# Patient Record
Sex: Female | Born: 1985 | Race: Black or African American | Hispanic: No | Marital: Single | Smoking: Current every day smoker
Health system: Southern US, Community
[De-identification: ages and names within clinical notes are randomized; demographics above are authoritative.]

## PROBLEM LIST (undated history)

## (undated) DIAGNOSIS — N83299 Other ovarian cyst, unspecified side: Secondary | ICD-10-CM

## (undated) DIAGNOSIS — G43909 Migraine, unspecified, not intractable, without status migrainosus: Secondary | ICD-10-CM

## (undated) DIAGNOSIS — F419 Anxiety disorder, unspecified: Secondary | ICD-10-CM

## (undated) DIAGNOSIS — I10 Essential (primary) hypertension: Secondary | ICD-10-CM

---
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
## 2003-07-23 ENCOUNTER — Emergency Department (HOSPITAL_COMMUNITY): Admission: EM | Admit: 2003-07-23 | Discharge: 2003-07-23 | Payer: Self-pay | Admitting: Emergency Medicine

## 2003-12-03 ENCOUNTER — Inpatient Hospital Stay (HOSPITAL_COMMUNITY): Admission: AD | Admit: 2003-12-03 | Discharge: 2003-12-03 | Payer: Self-pay | Admitting: Family Medicine

## 2004-02-18 ENCOUNTER — Emergency Department (HOSPITAL_COMMUNITY): Admission: EM | Admit: 2004-02-18 | Discharge: 2004-02-19 | Payer: Self-pay | Admitting: Emergency Medicine

## 2004-04-27 ENCOUNTER — Inpatient Hospital Stay (HOSPITAL_COMMUNITY): Admission: AD | Admit: 2004-04-27 | Discharge: 2004-04-27 | Payer: Self-pay | Admitting: Family Medicine

## 2004-06-03 ENCOUNTER — Inpatient Hospital Stay (HOSPITAL_COMMUNITY): Admission: EM | Admit: 2004-06-03 | Discharge: 2004-06-06 | Payer: Self-pay | Admitting: Psychiatry

## 2004-06-03 ENCOUNTER — Ambulatory Visit: Payer: Self-pay | Admitting: Psychiatry

## 2004-08-12 ENCOUNTER — Inpatient Hospital Stay (HOSPITAL_COMMUNITY): Admission: AD | Admit: 2004-08-12 | Discharge: 2004-08-12 | Payer: Self-pay | Admitting: *Deleted

## 2004-09-21 ENCOUNTER — Inpatient Hospital Stay (HOSPITAL_COMMUNITY): Admission: AD | Admit: 2004-09-21 | Discharge: 2004-09-21 | Payer: Self-pay | Admitting: Obstetrics and Gynecology

## 2004-11-20 ENCOUNTER — Ambulatory Visit: Payer: Self-pay | Admitting: *Deleted

## 2004-11-20 ENCOUNTER — Inpatient Hospital Stay (HOSPITAL_COMMUNITY): Admission: AD | Admit: 2004-11-20 | Discharge: 2004-11-21 | Payer: Self-pay | Admitting: Obstetrics & Gynecology

## 2004-12-06 ENCOUNTER — Ambulatory Visit: Payer: Self-pay | Admitting: Obstetrics and Gynecology

## 2004-12-06 ENCOUNTER — Inpatient Hospital Stay (HOSPITAL_COMMUNITY): Admission: AD | Admit: 2004-12-06 | Discharge: 2004-12-06 | Payer: Self-pay | Admitting: Obstetrics & Gynecology

## 2004-12-09 ENCOUNTER — Ambulatory Visit: Payer: Self-pay | Admitting: *Deleted

## 2004-12-09 ENCOUNTER — Inpatient Hospital Stay (HOSPITAL_COMMUNITY): Admission: AD | Admit: 2004-12-09 | Discharge: 2004-12-12 | Payer: Self-pay | Admitting: *Deleted

## 2004-12-10 ENCOUNTER — Encounter (INDEPENDENT_AMBULATORY_CARE_PROVIDER_SITE_OTHER): Payer: Self-pay | Admitting: *Deleted

## 2006-02-23 ENCOUNTER — Emergency Department (HOSPITAL_COMMUNITY): Admission: EM | Admit: 2006-02-23 | Discharge: 2006-02-23 | Payer: Self-pay | Admitting: Family Medicine

## 2006-02-26 ENCOUNTER — Emergency Department (HOSPITAL_COMMUNITY): Admission: EM | Admit: 2006-02-26 | Discharge: 2006-02-26 | Payer: Self-pay | Admitting: Emergency Medicine

## 2006-03-13 ENCOUNTER — Emergency Department (HOSPITAL_COMMUNITY): Admission: EM | Admit: 2006-03-13 | Discharge: 2006-03-13 | Payer: Self-pay | Admitting: Emergency Medicine

## 2006-06-27 ENCOUNTER — Emergency Department (HOSPITAL_COMMUNITY): Admission: EM | Admit: 2006-06-27 | Discharge: 2006-06-27 | Payer: Self-pay | Admitting: Emergency Medicine

## 2006-11-24 ENCOUNTER — Emergency Department (HOSPITAL_COMMUNITY): Admission: EM | Admit: 2006-11-24 | Discharge: 2006-11-25 | Payer: Self-pay | Admitting: Emergency Medicine

## 2009-03-12 ENCOUNTER — Emergency Department (HOSPITAL_COMMUNITY): Admission: EM | Admit: 2009-03-12 | Discharge: 2009-03-12 | Payer: Self-pay | Admitting: Emergency Medicine

## 2009-06-14 ENCOUNTER — Emergency Department (HOSPITAL_COMMUNITY): Admission: EM | Admit: 2009-06-14 | Discharge: 2009-06-15 | Payer: Self-pay | Admitting: Emergency Medicine

## 2010-06-03 ENCOUNTER — Emergency Department (HOSPITAL_COMMUNITY)
Admission: EM | Admit: 2010-06-03 | Discharge: 2010-06-03 | Disposition: A | Discharge status: Home / Self Care | Payer: Self-pay | Attending: Emergency Medicine | Admitting: Emergency Medicine

## 2010-06-03 DIAGNOSIS — F1010 Alcohol abuse, uncomplicated: Secondary | ICD-10-CM | POA: Insufficient documentation

## 2010-06-03 DIAGNOSIS — F411 Generalized anxiety disorder: Secondary | ICD-10-CM | POA: Insufficient documentation

## 2010-06-03 LAB — RAPID URINE DRUG SCREEN, HOSP PERFORMED
Barbiturates: NOT DETECTED
Benzodiazepines: NOT DETECTED
Cocaine: NOT DETECTED
Tetrahydrocannabinol: POSITIVE — AB

## 2010-06-03 LAB — CBC
MCH: 32.3 pg (ref 26.0–34.0)
MCV: 95.2 fL (ref 78.0–100.0)
RBC: 4.33 MIL/uL (ref 3.87–5.11)

## 2010-06-03 LAB — COMPREHENSIVE METABOLIC PANEL
ALT: 10 U/L (ref 0–35)
Alkaline Phosphatase: 91 U/L (ref 39–117)
BUN: 6 mg/dL (ref 6–23)
Glucose, Bld: 90 mg/dL (ref 70–99)
Total Protein: 7.9 g/dL (ref 6.0–8.3)

## 2010-06-03 LAB — ETHANOL: Alcohol, Ethyl (B): <5 mg/dL (ref 0–10)

## 2010-06-04 LAB — POCT I-STAT, CHEM 8
Chloride: 107 mEq/L (ref 96–112)
Glucose, Bld: 93 mg/dL (ref 70–99)
HCT: 50 % — ABNORMAL HIGH (ref 36.0–46.0)
Hemoglobin: 17 g/dL — ABNORMAL HIGH (ref 12.0–15.0)

## 2010-06-04 LAB — ETHANOL: Alcohol, Ethyl (B): 205 mg/dL — ABNORMAL HIGH (ref 0–10)

## 2010-06-16 LAB — RAPID STREP SCREEN (MED CTR MEBANE ONLY): Streptococcus, Group A Screen (Direct): NEGATIVE
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
## 2010-12-26 LAB — URINALYSIS, ROUTINE W REFLEX MICROSCOPIC
Glucose, UA: NEGATIVE
Nitrite: NEGATIVE
Specific Gravity, Urine: 1.02

## 2010-12-26 LAB — GC/CHLAMYDIA PROBE AMP, GENITAL
Chlamydia, DNA Probe: POSITIVE — AB
GC Probe Amp, Genital: NEGATIVE

## 2010-12-26 LAB — URINE MICROSCOPIC-ADD ON

## 2010-12-26 LAB — URINE CULTURE

## 2010-12-26 LAB — POCT PREGNANCY, URINE: Operator id: 277751

## 2010-12-26 LAB — WET PREP, GENITAL: Clue Cells Wet Prep HPF POC: NONE SEEN

## 2010-12-26 LAB — CBC
HCT: 39.7
MCV: 93.2
Platelets: 214
WBC: 13.6 — ABNORMAL HIGH

## 2011-02-10 ENCOUNTER — Encounter: Payer: Self-pay | Admitting: Emergency Medicine

## 2011-02-10 ENCOUNTER — Emergency Department (HOSPITAL_COMMUNITY): Payer: Self-pay

## 2011-02-10 ENCOUNTER — Emergency Department (HOSPITAL_COMMUNITY)
Admission: EM | Admit: 2011-02-10 | Discharge: 2011-02-10 | Disposition: A | Discharge status: Home / Self Care | Payer: No Typology Code available for payment source | Attending: Emergency Medicine | Admitting: Emergency Medicine

## 2011-02-10 DIAGNOSIS — S99929A Unspecified injury of unspecified foot, initial encounter: Secondary | ICD-10-CM | POA: Insufficient documentation

## 2011-02-10 DIAGNOSIS — I10 Essential (primary) hypertension: Secondary | ICD-10-CM | POA: Insufficient documentation

## 2011-02-10 DIAGNOSIS — IMO0002 Reserved for concepts with insufficient information to code with codable children: Secondary | ICD-10-CM | POA: Insufficient documentation

## 2011-02-10 DIAGNOSIS — S99921A Unspecified injury of right foot, initial encounter: Secondary | ICD-10-CM

## 2011-02-10 DIAGNOSIS — M79609 Pain in unspecified limb: Secondary | ICD-10-CM | POA: Insufficient documentation

## 2011-02-10 DIAGNOSIS — S8990XA Unspecified injury of unspecified lower leg, initial encounter: Secondary | ICD-10-CM | POA: Insufficient documentation

## 2011-02-10 HISTORY — DX: Essential (primary) hypertension: I10

## 2011-02-10 MED ORDER — IBUPROFEN 600 MG PO TABS: 600.0000 mg | ORAL_TABLET | Freq: Four times a day (QID) | ORAL | Status: AC | PRN
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
## 2011-02-10 NOTE — ED Provider Notes (Signed)
Medical screening examination/treatment/procedure(s) were performed by non-physician practitioner and as supervising physician I was immediately available for consultation/collaboration.  Nicholes Stairs, MD 02/10/11 [phone]
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
## 2011-02-10 NOTE — Progress Notes (Signed)
Orthopedic Tech Progress Note Patient Details:  Denise Perry [birth year] [id number]  Other Ortho Devices Ortho Device Location: OP SHOE   [name] 02/10/2011, 6:29 AM                             OP SHOE

## 2011-09-16 ENCOUNTER — Encounter (HOSPITAL_COMMUNITY): Payer: Self-pay | Admitting: Emergency Medicine

## 2011-09-16 ENCOUNTER — Emergency Department (HOSPITAL_COMMUNITY)
Admission: EM | Admit: 2011-09-16 | Discharge: 2011-09-16 | Disposition: A | Discharge status: Home / Self Care | Payer: Self-pay | Attending: Emergency Medicine | Admitting: Emergency Medicine

## 2011-09-16 DIAGNOSIS — IMO0002 Reserved for concepts with insufficient information to code with codable children: Secondary | ICD-10-CM | POA: Insufficient documentation

## 2011-09-16 DIAGNOSIS — L237 Allergic contact dermatitis due to plants, except food: Secondary | ICD-10-CM

## 2011-09-16 DIAGNOSIS — L255 Unspecified contact dermatitis due to plants, except food: Secondary | ICD-10-CM | POA: Insufficient documentation

## 2011-09-16 DIAGNOSIS — I10 Essential (primary) hypertension: Secondary | ICD-10-CM | POA: Insufficient documentation

## 2011-09-16 MED ORDER — PREDNISONE 20 MG PO TABS: 40.0000 mg | ORAL_TABLET | Freq: Every day | ORAL | Status: DC

## 2011-09-16 MED ORDER — PREDNISONE 20 MG PO TABS
60.0000 mg | ORAL_TABLET | Freq: Once | ORAL | Status: AC
  Administered 2011-09-16: 60 mg via ORAL
  Filled 2011-09-16: qty 3

## 2011-09-16 NOTE — ED Notes (Signed)
Pt received 50mg  benadryl po PTA via EMS

## 2011-09-16 NOTE — ED Notes (Signed)
Pt arrives via EMS for itchy rash that began Friday.

## 2011-09-16 NOTE — ED Notes (Signed)
Socks given to pt and a blanket

## 2011-09-16 NOTE — ED Notes (Signed)
Pt denies any pain, pt's respirations are equal and non labored. 

## 2011-09-16 NOTE — ED Provider Notes (Signed)
History     CSN: [id number]  Arrival date & time 09/16/11  0725   First MD Initiated Contact with Patient 09/16/11 [phone]      Chief Complaint  Patient presents with  . Rash    (Consider location/radiation/quality/duration/timing/severity/associated sxs/prior treatment) Patient is a 26 y.o. female presenting with rash. The history is provided by the patient.  Rash   She had a generalized rash for the last 5 days. She has one son who is a similar rash and another son who does not. There is poison ivy around where they are staying and she is concerned it might be poison ivy. The rash has been spreading and is on all parts of her body now. It is very pruritic. She denies difficulty breathing or swallowing.  Past Medical History  Diagnosis Date  . Hypertension     History reviewed. No pertinent past surgical history.  History reviewed. No pertinent family history.  History  Substance Use Topics  . Smoking status: Never Smoker   . Smokeless tobacco: Not on file  . Alcohol Use: No    OB History    Grav Para Term Preterm Abortions TAB SAB Ect Mult Living                  Review of Systems  Skin: Positive for rash.  All other systems reviewed and are negative.    Allergies  Review of patient's allergies indicates no known allergies.  Home Medications   Current Outpatient Rx  Name Route Sig Dispense Refill  . PREDNISONE 20 MG PO TABS Oral Take 2 tablets (40 mg total) by mouth daily. 30 tablet 0    BP 126/79  Pulse 78  Temp 98.2 F (36.8 C) (Oral)  Resp 20  SpO2 100%  Physical Exam  Nursing note and vitals reviewed.  26 year old female who is resting comfortably and in no acute distress. Vital signs are normal. Oxygen saturation is 100% which is normal. Head is normocephalic and atraumatic. PERRLA, EOMI. Oropharynx is clear. Neck is nontender and supple without adenopathy or stridor. Back is nontender. Lungs are clear without rales, wheezes, or rhonchi. Heart has  regular rate and rhythm without murmur. Abdomen is soft, flat, nontender without masses or hepatosplenomegaly. Extremities have no cyanosis or edema, full range of motion is present. Skin is warm and dry. Multiple raised erythematous lesions with some areas of vesiculation are present. Lesions are grouped in linear clusters suggestive of contact dermatitis such as poison ivy. Neurologic: Mental status is normal, cranial nerves are intact, there are no motor or sensory deficits.  ED Course  Procedures (including critical care time)  Labs Reviewed - No data to display No results found.   1. Poison ivy       MDM  Rash most consistent with poison ivy dermatitis. She will be treated with oral steroids and oral loratadine        Dione Booze, MD 09/16/11 [phone]

## 2011-10-11 ENCOUNTER — Encounter (HOSPITAL_COMMUNITY): Payer: Self-pay | Admitting: Emergency Medicine

## 2011-10-11 ENCOUNTER — Emergency Department (HOSPITAL_COMMUNITY)
Admission: EM | Admit: 2011-10-11 | Discharge: 2011-10-11 | Disposition: A | Discharge status: Home / Self Care | Payer: Self-pay | Attending: Emergency Medicine | Admitting: Emergency Medicine

## 2011-10-11 DIAGNOSIS — F1010 Alcohol abuse, uncomplicated: Secondary | ICD-10-CM | POA: Insufficient documentation

## 2011-10-11 DIAGNOSIS — F10129 Alcohol abuse with intoxication, unspecified: Secondary | ICD-10-CM

## 2011-10-11 DIAGNOSIS — F10929 Alcohol use, unspecified with intoxication, unspecified: Secondary | ICD-10-CM

## 2011-10-11 DIAGNOSIS — I10 Essential (primary) hypertension: Secondary | ICD-10-CM | POA: Insufficient documentation

## 2011-10-11 MED ORDER — GI COCKTAIL ~~LOC~~
30.0000 mL | Freq: Once | ORAL | Status: AC
  Administered 2011-10-11: 30 mL via ORAL
  Filled 2011-10-11: qty 30

## 2011-10-11 MED ORDER — ONDANSETRON 8 MG PO TBDP: 8.0000 mg | ORAL_TABLET | Freq: Three times a day (TID) | ORAL | Status: AC | PRN

## 2011-10-11 MED ORDER — ONDANSETRON 4 MG PO TBDP
4.0000 mg | ORAL_TABLET | Freq: Once | ORAL | Status: AC
  Administered 2011-10-11: 4 mg via ORAL
  Filled 2011-10-11: qty 1

## 2011-10-11 NOTE — ED Notes (Signed)
Patient here with children.

## 2011-10-11 NOTE — ED Provider Notes (Signed)
Medical screening examination/treatment/procedure(s) were performed by non-physician practitioner and as supervising physician I was immediately available for consultation/collaboration.  Luv Mish M Christe Tellez, MD 10/11/11 0759 

## 2011-10-11 NOTE — ED Notes (Signed)
Pt comes to the ED c/o dizziness from drinking a cup of vodka.  Pt states she thinks she had too much to drink and vomited twice prior to arrival in the ED.
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
## 2011-10-11 NOTE — ED Provider Notes (Signed)
History     CSN: [id number]  Arrival date & time 10/11/11  9147   First MD Initiated Contact with Patient 10/11/11 0448     5:35 AM HPI Patient reports drinking a cup of vodka. Reports she came by EMS today due to feeling dehydrated for vomiting twice, and headache and dizziness. Patient reports she has not usually  Patient is a 26 y.o. female presenting with intoxication. The history is provided by the patient.  Alcohol Intoxication This is a new problem. The current episode started today. The problem has been unchanged. Associated symptoms include nausea and vomiting.    Past Medical History  Diagnosis Date  . Hypertension     History reviewed. No pertinent past surgical history.  No family history on file.  History  Substance Use Topics  . Smoking status: Never Smoker   . Smokeless tobacco: Not on file  . Alcohol Use: No    OB History    Grav Para Term Preterm Abortions TAB SAB Ect Mult Living                  Review of Systems  Gastrointestinal: Positive for nausea and vomiting.  All other systems reviewed and are negative.    Allergies  Review of patient's allergies indicates no known allergies.  Home Medications   Current Outpatient Rx  Name Route Sig Dispense Refill  . ACETAMINOPHEN 500 MG PO TABS Oral Take 500 mg by mouth every 6 (six) hours as needed. For pain      BP 111/72  Pulse 79  Temp 97.4 F (36.3 C) (Oral)  Resp 14  SpO2 99%  Physical Exam  Vitals reviewed. Constitutional: She is oriented to person, place, and time. Vital signs are normal. She appears well-developed and well-nourished.       Patient sleeping comfortably in room.  HENT:  Head: Normocephalic and atraumatic.  Eyes: Conjunctivae are normal. Pupils are equal, round, and reactive to light.  Neck: Normal range of motion. Neck supple.  Cardiovascular: Normal rate, regular rhythm and normal heart sounds.  Exam reveals no friction rub.   No murmur heard. Pulmonary/Chest:  Effort normal and breath sounds normal. She has no wheezes. She has no rhonchi. She has no rales. She exhibits no tenderness.  Musculoskeletal: Normal range of motion.  Neurological: She is alert and oriented to person, place, and time. Coordination normal.  Skin: Skin is warm and dry. No rash noted. No erythema. No pallor.    ED Course  Procedures  MDM  Will order Zofran and GI cocktail. Also order a by mouth trial. Discussed with patient he voices understanding. Advised patient structures warm water. Do not feel like lab work is necessary at this time. Patient does not appear to be in any acute distress in length he is having symptoms do to alcohol intoxication       Thomasene Lot, PA-C 10/11/11 [phone]

## 2011-11-21 ENCOUNTER — Encounter (HOSPITAL_COMMUNITY): Payer: Self-pay | Admitting: Emergency Medicine

## 2011-11-21 ENCOUNTER — Emergency Department (HOSPITAL_COMMUNITY): Payer: Self-pay

## 2011-11-21 ENCOUNTER — Emergency Department (HOSPITAL_COMMUNITY)
Admission: EM | Admit: 2011-11-21 | Discharge: 2011-11-21 | Disposition: A | Discharge status: Home / Self Care | Payer: Self-pay | Attending: Emergency Medicine | Admitting: Emergency Medicine

## 2011-11-21 DIAGNOSIS — F17200 Nicotine dependence, unspecified, uncomplicated: Secondary | ICD-10-CM | POA: Insufficient documentation

## 2011-11-21 DIAGNOSIS — I10 Essential (primary) hypertension: Secondary | ICD-10-CM | POA: Insufficient documentation

## 2011-11-21 DIAGNOSIS — F19988 Other psychoactive substance use, unspecified with other psychoactive substance-induced disorder: Secondary | ICD-10-CM | POA: Insufficient documentation

## 2011-11-21 DIAGNOSIS — F19929 Other psychoactive substance use, unspecified with intoxication, unspecified: Secondary | ICD-10-CM

## 2011-11-21 MED ORDER — LORAZEPAM 1 MG PO TABS
1.0000 mg | ORAL_TABLET | Freq: Once | ORAL | Status: AC
  Administered 2011-11-21: 1 mg via ORAL
  Filled 2011-11-21: qty 1

## 2011-11-21 NOTE — ED Provider Notes (Signed)
History     CSN: [id number]  Arrival date & time 11/21/11  1423   None     Chief Complaint  Patient presents with  . Anxiety    (Consider location/radiation/quality/duration/timing/severity/associated sxs/prior treatment) HPI Comments: Patient presents with chest pain and anxiety after taking an ecstasy pill about 7 hours ago. She reports taking the pill and smoking marijuana afterwards. Shortly after, she became anxious and upset. She reports chest pain, SOB, and feeling like her heart is racing. She describes the pain as burning and substernal without radiation. She denies any aggravating or alleviating factors. She reports associated dry mouth. She has never had this reaction to ectasy prior to this episode, according to the patient.   Patient is a 26 y.o. female presenting with anxiety.  Anxiety Associated symptoms include chest pain, chills, numbness and weakness.    Past Medical History  Diagnosis Date  . Hypertension     History reviewed. No pertinent past surgical history.  No family history on file.  History  Substance Use Topics  . Smoking status: Current Everyday Smoker -- 1.0 packs/day  . Smokeless tobacco: Not on file  . Alcohol Use: Yes     beer 18 oz    OB History    Grav Para Term Preterm Abortions TAB SAB Ect Mult Living                  Review of Systems  Constitutional: Positive for chills.  Respiratory: Positive for shortness of breath.   Cardiovascular: Positive for chest pain.  Neurological: Positive for weakness and numbness.  All other systems reviewed and are negative.    Allergies  Review of patient's allergies indicates no known allergies.  Home Medications  No current outpatient prescriptions on file.  BP 173/156  Pulse 95  Temp 98.2 F (36.8 C) (Oral)  Resp 24  SpO2 100%  Physical Exam  Nursing note and vitals reviewed. Constitutional: She is oriented to person, place, and time. She appears well-developed and  well-nourished. She appears distressed.  HENT:  Head: Normocephalic and atraumatic.  Nose: Nose normal.  Mouth/Throat: Oropharynx is clear and moist. No oropharyngeal exudate.  Eyes: Conjunctivae and EOM are normal. No scleral icterus.       Pupils dilated and not reactive to light.   Neck: Normal range of motion. Neck supple.  Cardiovascular: Normal rate, regular rhythm and intact distal pulses.  Exam reveals no gallop and no friction rub.   No murmur heard. Pulmonary/Chest: Effort normal and breath sounds normal. No respiratory distress. She has no wheezes. She has no rales. She exhibits no tenderness.  Abdominal: Soft. There is no tenderness.  Musculoskeletal: Normal range of motion.  Neurological: She is alert and oriented to person, place, and time. Coordination normal.  Skin: Skin is warm and dry. She is not diaphoretic.  Psychiatric:       Patient is anxious and crying.     ED Course  Procedures (including critical care time)  Labs Reviewed - No data to display No results found.   No diagnosis found.    MDM  3:37 PM Patient is anxious and upset, complaining of chest pain and SOB. I will order an EKG, chest xray, and ativan. I will reassess when her tests have resulted.   5:58 PM Patient's EKG is unremarkable and she is feeling better and would like to go home. No further evaluation needed at this time.      Emilia Beck, PA-C 11/21/11 1758

## 2011-11-21 NOTE — ED Notes (Addendum)
Pt asking for water while attempting to obtain VS. Explained policy to pt that she may not have anything to eat or drink until EDP evaluates her. Pt states "my mouth is dry, this IV isn't making my mouth dry, I have cotton mouth!" Pt then proceeded to get up and drink water from sink. Nurse notified.

## 2011-11-21 NOTE — ED Notes (Signed)
Pt states she took an ectasy pill early this morning around 0900. Is crying, upset agitated.

## 2011-11-21 NOTE — ED Notes (Signed)
Pt alert and oriented x4. Respirations even and unlabored, bilateral symmetrical rise and fall of chest. Skin warm and dry. In no acute distress. Denies needs.   

## 2011-11-22 NOTE — ED Provider Notes (Signed)
Medical screening examination/treatment/procedure(s) were performed by non-physician practitioner and as supervising physician I was immediately available for consultation/collaboration.   Richardean Canal, MD 11/22/11 [phone]

## 2012-05-27 ENCOUNTER — Emergency Department (HOSPITAL_COMMUNITY)
Admission: EM | Admit: 2012-05-27 | Discharge: 2012-05-27 | Disposition: A | Discharge status: Home / Self Care | Payer: Self-pay | Attending: Emergency Medicine | Admitting: Emergency Medicine

## 2012-05-27 ENCOUNTER — Other Ambulatory Visit: Payer: Self-pay

## 2012-05-27 DIAGNOSIS — R002 Palpitations: Secondary | ICD-10-CM | POA: Insufficient documentation

## 2012-05-27 DIAGNOSIS — I10 Essential (primary) hypertension: Secondary | ICD-10-CM | POA: Insufficient documentation

## 2012-05-27 DIAGNOSIS — I517 Cardiomegaly: Secondary | ICD-10-CM | POA: Insufficient documentation

## 2012-05-27 DIAGNOSIS — I4510 Unspecified right bundle-branch block: Secondary | ICD-10-CM | POA: Insufficient documentation

## 2012-05-27 DIAGNOSIS — I440 Atrioventricular block, first degree: Secondary | ICD-10-CM | POA: Insufficient documentation

## 2012-05-27 DIAGNOSIS — F17200 Nicotine dependence, unspecified, uncomplicated: Secondary | ICD-10-CM | POA: Insufficient documentation

## 2012-05-27 NOTE — ED Provider Notes (Signed)
History     CSN: [id number]  Arrival date & time 05/27/12  4098   First MD Initiated Contact with Patient 05/27/12 0133      Chief Complaint  Patient presents with  . Chest Pain  . Panic Attack    (Consider location/radiation/quality/duration/timing/severity/associated sxs/prior treatment) HPI 27 yo woman with history of HTN who presents via EMS from home with two young children. Says she awoke from sleep with tightness in her chest. Long history of same since "I was a kid". Seems to occur randomly. Patient says she has associated tight feeling with breathing. No true SOB. Notes associated feeling of rapid heart beat. Entire episode lasted 2 to 3 minutes and resolved without intervention.   Sx seem to intensify with she gets worried about the cause of her chest tightness. She is currently symptom free at present.   Denies street drug use. Smokes 1ppd.     Past Medical History  Diagnosis Date  . Hypertension     No past surgical history on file.  No family history on file.  History  Substance Use Topics  . Smoking status: Current Every Day Smoker -- 1.00 packs/day  . Smokeless tobacco: Not on file  . Alcohol Use: Yes     Comment: beer 18 oz    OB History   Grav Para Term Preterm Abortions TAB SAB Ect Mult Living                  Review of Systems Gen: no weight loss, fevers, chills, night sweats Eyes: no discharge or drainage, no occular pain or visual changes Nose: no epistaxis or rhinorrhea Mouth: no dental pain, no sore throat Neck: no neck pain Lungs: no SOB, cough, wheezing CV: As per history of present illness, otherwise negative Abd: no abdominal pain, nausea, vomiting GU: no dysuria or gross hematuria MSK: no myalgias or arthralgias Neuro: no headache, no focal neurologic deficits Skin: no rash Psyche: Patient has anxiety associated with the events described above. However, otherwise negative   Allergies  Review of patient's allergies indicates  no known allergies.  Home Medications  No current outpatient prescriptions on file.  BP 110/60  Pulse 80  Temp(Src) 97.1 F (36.2 C) (Oral)  Resp 19  SpO2 98%  LMP 05/06/2012  Physical Exam Gen: well developed and well nourished appearing Head: NCAT Eyes: PERL, EOMI Nose: no epistaixis or rhinorrhea Mouth/throat: mucosa is moist and pink Neck: supple, no stridor Lungs: CTA B, no wheezing, rhonchi or rales Cardiovascular: Regular rate and rhythm without murmur, strong and equal peripheral pulses. Abd: soft, notender, nondistended Back: no ttp, no cva ttp Skin: no rashese, wnl Neuro: CN ii-xii grossly intact, no focal deficits Psyche; normal affect,  calm and cooperative.   ED Course  Procedures (including critical care time)  EKG shows a normal sinus rhythm with a rate of about 90 there is first degree AV block noticed, there is incomplete right bundle-branch block noted, left ventricular hypertrophy is noted, intervals are normal.   MDM  Patient describes symptoms that occur almost every day and have been ongoing for at least 10 years. She says her symptoms were in no way different tonight than they have been before. She denies chest pain and SOB. Her EKG is reassuring. I wonder if she is experiencing episodes of SVT or, perhaps, ectopic beats. The patient is stable for discharge. She has Medicaid pending and will follow up with her assigned PCP to discuss outpatient cardiac monitoring, ie. Holter monitoring.  I have counseled the patient to return to the ED for symptoms that last longer than 30 minutes or if she has any urgent health concerns. She says she is comfortable with this plan.         Brandt Loosen, MD 05/27/12 [phone]

## 2012-05-27 NOTE — ED Notes (Signed)
Pt was awakened from sleep with pain in the center of her chest, SOB, and dry mouth.  This was accompanied by anxiety.  Pt states that she has had this happen several times in the past and it was concluded that it was anxiety.  2 children at bedside.  EMS 126/74, 92, 20, 100%RA.

## 2012-10-27 ENCOUNTER — Encounter (HOSPITAL_COMMUNITY): Payer: Self-pay | Admitting: *Deleted

## 2012-10-27 DIAGNOSIS — N390 Urinary tract infection, site not specified: Secondary | ICD-10-CM | POA: Insufficient documentation

## 2012-10-27 DIAGNOSIS — I10 Essential (primary) hypertension: Secondary | ICD-10-CM | POA: Insufficient documentation

## 2012-10-27 DIAGNOSIS — Z3202 Encounter for pregnancy test, result negative: Secondary | ICD-10-CM | POA: Insufficient documentation

## 2012-10-27 DIAGNOSIS — F17200 Nicotine dependence, unspecified, uncomplicated: Secondary | ICD-10-CM | POA: Insufficient documentation

## 2012-10-27 LAB — URINALYSIS, ROUTINE W REFLEX MICROSCOPIC
Bilirubin Urine: NEGATIVE
Glucose, UA: NEGATIVE mg/dL
Specific Gravity, Urine: 1.016 (ref 1.005–1.030)
Urobilinogen, UA: 1 mg/dL (ref 0.0–1.0)

## 2012-10-27 LAB — CBC WITH DIFFERENTIAL/PLATELET
Lymphocytes Relative: 17 % (ref 12–46)
Lymphs Abs: 2.6 10*3/uL (ref 0.7–4.0)
Neutro Abs: 11.5 10*3/uL — ABNORMAL HIGH (ref 1.7–7.7)
Neutrophils Relative %: 76 % (ref 43–77)
Platelets: 216 10*3/uL (ref 150–400)
RBC: 4.48 MIL/uL (ref 3.87–5.11)
WBC: 15.1 10*3/uL — ABNORMAL HIGH (ref 4.0–10.5)

## 2012-10-27 LAB — COMPREHENSIVE METABOLIC PANEL
ALT: 9 U/L (ref 0–35)
Alkaline Phosphatase: 79 U/L (ref 39–117)
CO2: 22 mEq/L (ref 19–32)
GFR calc Af Amer: >90 mL/min (ref >90)
GFR calc non Af Amer: >90 mL/min (ref >90)
Glucose, Bld: 85 mg/dL (ref 70–99)
Potassium: 3.9 mEq/L (ref 3.5–5.1)
Sodium: 136 mEq/L (ref 135–145)

## 2012-10-27 LAB — URINE MICROSCOPIC-ADD ON

## 2012-10-27 NOTE — ED Notes (Signed)
The pt arrived by ems abd pain for 3 days.  Sl diarrhea and pressure sensation.  lmp aug 2

## 2012-10-27 NOTE — ED Notes (Signed)
Pt with generalized lower abdominal pain for the last 3 days. Arrives via EMS. Denies nausea/vomiting/diarrhea/fever. Pt alert, oriented x4, NAD. Unknown LMP.

## 2012-10-27 NOTE — ED Notes (Signed)
Pt refused repeat vital signs in waiting room.

## 2012-10-28 ENCOUNTER — Emergency Department (HOSPITAL_COMMUNITY)
Admission: EM | Admit: 2012-10-28 | Discharge: 2012-10-28 | Disposition: A | Discharge status: Home / Self Care | Payer: Self-pay | Attending: Emergency Medicine | Admitting: Emergency Medicine

## 2012-10-28 ENCOUNTER — Emergency Department (HOSPITAL_COMMUNITY): Payer: Self-pay

## 2012-10-28 DIAGNOSIS — N390 Urinary tract infection, site not specified: Secondary | ICD-10-CM

## 2012-10-28 MED ORDER — DEXTROSE 5 % IV SOLN
1.0000 g | Freq: Once | INTRAVENOUS | Status: AC
  Administered 2012-10-28: 1 g via INTRAVENOUS
  Filled 2012-10-28: qty 10

## 2012-10-28 MED ORDER — CEPHALEXIN 500 MG PO CAPS: 500.0000 mg | ORAL_CAPSULE | Freq: Four times a day (QID) | ORAL | Status: DC

## 2012-10-28 MED ORDER — SODIUM CHLORIDE 0.9 % IV BOLUS (SEPSIS)
1000.0000 mL | Freq: Once | INTRAVENOUS | Status: AC
  Administered 2012-10-28: 1000 mL via INTRAVENOUS

## 2012-10-28 MED ORDER — ONDANSETRON HCL 4 MG PO TABS: 4.0000 mg | ORAL_TABLET | Freq: Four times a day (QID) | ORAL | Status: DC

## 2012-10-28 MED ORDER — ONDANSETRON HCL 4 MG/2ML IJ SOLN
4.0000 mg | Freq: Once | INTRAMUSCULAR | Status: AC
  Administered 2012-10-28: 4 mg via INTRAVENOUS
  Filled 2012-10-28: qty 2

## 2012-10-28 NOTE — ED Notes (Signed)
Pt transported to [facility]

## 2012-10-28 NOTE — ED Provider Notes (Signed)
CSN: [id number]     Arrival date & time 10/27/12  1922 History     First MD Initiated Contact with Patient 10/28/12 0100     Chief Complaint  Patient presents with  . Abdominal Pain   (Consider location/radiation/quality/duration/timing/severity/associated sxs/prior Treatment) HPI Comments: Patient presents with 3 days of constant upper abdominal pain contrary to triage note it is not lower pain. Pain is right upper quadrant and epigastric. Nothing makes it better or worse. No nausea, vomiting, diarrhea, fever. No dysuria or hematuria. No vaginal bleeding or discharge. No change in bowel or bladder habits. No chest pain or shortness of breath. Has never had this pain in the past. Good by mouth intake and urine output.  The history is provided by the patient.    Past Medical History  Diagnosis Date  . Hypertension    History reviewed. No pertinent past surgical history. No family history on file. History  Substance Use Topics  . Smoking status: Current Every Day Smoker -- 1.00 packs/day  . Smokeless tobacco: Not on file  . Alcohol Use: Yes     Comment: beer 18 oz   OB History   Grav Para Term Preterm Abortions TAB SAB Ect Mult Living                 Review of Systems  Constitutional: Negative for fever, activity change and appetite change.  HENT: Negative for congestion and rhinorrhea.   Respiratory: Negative for cough, chest tightness and shortness of breath.   Cardiovascular: Negative for chest pain.  Gastrointestinal: Positive for abdominal pain. Negative for nausea, vomiting, diarrhea and constipation.  Genitourinary: Negative for dysuria, hematuria, vaginal bleeding and vaginal discharge.  Musculoskeletal: Negative for back pain.  Skin: Negative for rash.  Neurological: Negative for dizziness, weakness and headaches.  A complete 10 system review of systems was obtained and all systems are negative except as noted in the HPI and PMH.    Allergies  Review of  patient's allergies indicates no known allergies.  Home Medications   Current Outpatient Rx  Name  Route  Sig  Dispense  Refill  . ibuprofen (ADVIL,MOTRIN) 200 MG tablet   Oral   Take 600 mg by mouth every 6 (six) hours as needed for pain.         . cephALEXin (KEFLEX) 500 MG capsule   Oral   Take 1 capsule (500 mg total) by mouth 4 (four) times daily.   40 capsule   0   . ondansetron (ZOFRAN) 4 MG tablet   Oral   Take 1 tablet (4 mg total) by mouth every 6 (six) hours.   12 tablet   0    BP 111/73  Pulse 68  Temp(Src) 99.1 F (37.3 C) (Oral)  Resp 16  SpO2 100%  LMP 10/17/2012 Physical Exam  Constitutional: She is oriented to person, place, and time. She appears well-developed and well-nourished. No distress.  HENT:  Head: Normocephalic and atraumatic.  Mouth/Throat: Oropharynx is clear and moist. No oropharyngeal exudate.  Eyes: Conjunctivae and EOM are normal. Pupils are equal, round, and reactive to light.  Neck: Normal range of motion. Neck supple.  Cardiovascular: Normal rate, regular rhythm and normal heart sounds.   No murmur heard. Pulmonary/Chest: Effort normal and breath sounds normal.  Abdominal: Soft. There is tenderness. There is no rebound and no guarding.  Right upper quadrant epigastric tenderness without guarding or rebound. No lower abdominal tenderness. No pain at McBurney's point.  Musculoskeletal: Normal range of  motion. She exhibits no edema and no tenderness.  No CVA tenderness  Neurological: She is alert and oriented to person, place, and time. No cranial nerve deficit. She exhibits normal muscle tone. Coordination normal.  Skin: Skin is warm.    ED Course   Procedures (including critical care time)  Labs Reviewed  CBC WITH DIFFERENTIAL - Abnormal; Notable for the following:    WBC 15.1 (*)    Neutro Abs 11.5 (*)    All other components within normal limits  URINALYSIS, ROUTINE W REFLEX MICROSCOPIC - Abnormal; Notable for the  following:    APPearance CLOUDY (*)    Hgb urine dipstick LARGE (*)    Protein, ur 100 (*)    Nitrite POSITIVE (*)    Leukocytes, UA LARGE (*)    All other components within normal limits  URINE MICROSCOPIC-ADD ON - Abnormal; Notable for the following:    Squamous Epithelial / LPF FEW (*)    Bacteria, UA MANY (*)    All other components within normal limits  URINE CULTURE  COMPREHENSIVE METABOLIC PANEL  LIPASE, BLOOD  POCT PREGNANCY, URINE   US Abdomen Complete  10/28/2012   *RADIOLOGY REPORT*  Clinical Data:  Right upper quadrant abdominal pain.  COMPLETE ABDOMINAL ULTRASOUND  Comparison:  No priors.  Findings:  Gallbladder:  No shadowing gallstones or echogenic sludge.  No gallbladder wall thickening or pericholecystic fluid.  Negative sonographic Murphy's sign according to the ultrasound technologist.  Common bile duct:  Normal caliber measuring 3.3 mm in the porta hepatis.  Liver:  No focal mass lesion seen.  Within normal limits in parenchymal echogenicity.  IVC:  Patent throughout its visualized course in the abdomen.  Pancreas:  Although the pancreas is difficult to visualize in its entirety, no focal pancreatic abnormality is identified.  Spleen:  Normal size and echotexture without focal parenchymal abnormality.  9.5 cm in length.  Right Kidney:  No hydronephrosis.  Well-preserved cortex.  Normal size and parenchymal echotexture without focal abnormalities.  11.5 cm in length.  Left Kidney:  No hydronephrosis.  Well-preserved cortex.  Normal size and parenchymal echotexture without focal abnormalities.  11.7 cm in length.  Abdominal aorta:  Normal caliber measuring 1.8 cm in diameter proximally, and tapering appropriately distally.  IMPRESSION: 1.  No acute findings in the abdomen to account for the patient's symptoms.  Specifically, no evidence of gallstones or findings to suggest acute cholecystitis at this time.   Original Report Authenticated By: Trudie Reed, M.D.   1. Urinary  tract infection     MDM  3 day history of constant upper abdominal pain not associated symptoms. Abdomen is soft without peritoneal signs there is no lower abdominal tenderness. Denies vaginal bleeding or discharge.  Patient's urinalysis is grossly infected. White count is 15. HCG negative. Rocephin given for UTI.  [place] negative for any gallbladder or other pathology. Tolerating PO.  Keflex for UTI. Return with worsening pain, fever, vomiting.  Glynn Octave, MD 10/28/12 [phone]

## 2012-10-29 LAB — URINE CULTURE

## 2012-10-30 ENCOUNTER — Telehealth (HOSPITAL_COMMUNITY): Payer: Self-pay | Admitting: Emergency Medicine

## 2012-10-30 NOTE — ED Notes (Signed)
Post ED Visit - Positive Culture Follow-up  Culture report reviewed by antimicrobial stewardship pharmacist: []  [name], Pharm.D., BCPS []  [name], Pharm.D., BCPS []  [name], [address].D., BCPS []  [name], [address].D., BCPS, AAHIVP []  [name], Pharm.D., BCPS, AAHIVP [x]  [name], [address].D.  Positive urine culture Treated with Keflex, organism sensitive to the same and no further patient follow-up is required at this time.  Denise Perry 10/30/2012, 1:29 PM

## 2013-03-11 ENCOUNTER — Emergency Department (HOSPITAL_COMMUNITY)
Admission: EM | Admit: 2013-03-11 | Discharge: 2013-03-11 | Discharge status: Against Medical Advice | Payer: Self-pay | Attending: Emergency Medicine | Admitting: Emergency Medicine

## 2013-03-11 ENCOUNTER — Encounter (HOSPITAL_COMMUNITY): Payer: Self-pay | Admitting: Emergency Medicine

## 2013-03-11 DIAGNOSIS — F1010 Alcohol abuse, uncomplicated: Secondary | ICD-10-CM | POA: Insufficient documentation

## 2013-03-11 DIAGNOSIS — Z3202 Encounter for pregnancy test, result negative: Secondary | ICD-10-CM | POA: Insufficient documentation

## 2013-03-11 DIAGNOSIS — I10 Essential (primary) hypertension: Secondary | ICD-10-CM | POA: Insufficient documentation

## 2013-03-11 DIAGNOSIS — F17200 Nicotine dependence, unspecified, uncomplicated: Secondary | ICD-10-CM | POA: Insufficient documentation

## 2013-03-11 LAB — RAPID URINE DRUG SCREEN, HOSP PERFORMED: Barbiturates: NOT DETECTED

## 2013-03-11 LAB — URINALYSIS, ROUTINE W REFLEX MICROSCOPIC
Bilirubin Urine: NEGATIVE
Nitrite: NEGATIVE
Specific Gravity, Urine: 1.007 (ref 1.005–1.030)
Urobilinogen, UA: 0.2 mg/dL (ref 0.0–1.0)
pH: 6 (ref 5.0–8.0)

## 2013-03-11 LAB — URINE MICROSCOPIC-ADD ON

## 2013-03-11 LAB — POCT PREGNANCY, URINE: Preg Test, Ur: NEGATIVE

## 2013-03-11 NOTE — ED Notes (Signed)
Patient attempted to leave, encourage patient to stay, patient states "i'm leaving" and was observed walking out.

## 2013-03-11 NOTE — ED Notes (Signed)
Patient presents today via EMS reporting not feeling well after taking 7 shots of white liquor within 2 hours, patient reports nausea, poor historian.

## 2013-03-11 NOTE — ED Notes (Signed)
Pt states that she had three shots of alcohol today. Pt states that she didn't feel right and called the ambulance. Pt states that she is currently feeling fine. Pt denies SI or HI. Pt cooperative with vitals answer questions but not wanting to stay for too much longer.

## 2013-03-13 LAB — URINE CULTURE

## 2013-06-03 ENCOUNTER — Emergency Department (HOSPITAL_COMMUNITY)
Admission: EM | Admit: 2013-06-03 | Discharge: 2013-06-03 | Disposition: A | Discharge status: Home / Self Care | Payer: Self-pay | Attending: Emergency Medicine | Admitting: Emergency Medicine

## 2013-06-03 ENCOUNTER — Encounter (HOSPITAL_COMMUNITY): Payer: Self-pay | Admitting: Emergency Medicine

## 2013-06-03 DIAGNOSIS — R112 Nausea with vomiting, unspecified: Secondary | ICD-10-CM | POA: Insufficient documentation

## 2013-06-03 DIAGNOSIS — F17200 Nicotine dependence, unspecified, uncomplicated: Secondary | ICD-10-CM | POA: Insufficient documentation

## 2013-06-03 DIAGNOSIS — I10 Essential (primary) hypertension: Secondary | ICD-10-CM | POA: Insufficient documentation

## 2013-06-03 DIAGNOSIS — Z792 Long term (current) use of antibiotics: Secondary | ICD-10-CM | POA: Insufficient documentation

## 2013-06-03 DIAGNOSIS — Z3202 Encounter for pregnancy test, result negative: Secondary | ICD-10-CM | POA: Insufficient documentation

## 2013-06-03 DIAGNOSIS — R1013 Epigastric pain: Secondary | ICD-10-CM | POA: Insufficient documentation

## 2013-06-03 LAB — CBC WITH DIFFERENTIAL/PLATELET
BASOS PCT: 0 % (ref 0–1)
Basophils Absolute: 0 10*3/uL (ref 0.0–0.1)
EOS ABS: 0 10*3/uL (ref 0.0–0.7)
EOS PCT: 0 % (ref 0–5)
HCT: 41.6 % (ref 36.0–46.0)
HEMOGLOBIN: 14.7 g/dL (ref 12.0–15.0)
LYMPHS ABS: 2.2 10*3/uL (ref 0.7–4.0)
Lymphocytes Relative: 14 % (ref 12–46)
MCH: 33.6 pg (ref 26.0–34.0)
MCHC: 35.3 g/dL (ref 30.0–36.0)
MCV: 95 fL (ref 78.0–100.0)
MONOS PCT: 5 % (ref 3–12)
Monocytes Absolute: 0.8 10*3/uL (ref 0.1–1.0)
NEUTROS PCT: 80 % — AB (ref 43–77)
Neutro Abs: 12 10*3/uL — ABNORMAL HIGH (ref 1.7–7.7)
PLATELETS: 210 10*3/uL (ref 150–400)
RBC: 4.38 MIL/uL (ref 3.87–5.11)
RDW: 13.1 % (ref 11.5–15.5)
WBC: 14.9 10*3/uL — ABNORMAL HIGH (ref 4.0–10.5)

## 2013-06-03 LAB — COMPREHENSIVE METABOLIC PANEL
ALBUMIN: 4.4 g/dL (ref 3.5–5.2)
ALT: 9 U/L (ref 0–35)
AST: 13 U/L (ref 0–37)
Alkaline Phosphatase: 96 U/L (ref 39–117)
BUN: 8 mg/dL (ref 6–23)
CALCIUM: 9.7 mg/dL (ref 8.4–10.5)
CO2: 24 mEq/L (ref 19–32)
Chloride: 103 mEq/L (ref 96–112)
Creatinine, Ser: 0.74 mg/dL (ref 0.50–1.10)
GFR calc non Af Amer: >90 mL/min (ref >90)
GLUCOSE: 100 mg/dL — AB (ref 70–99)
POTASSIUM: 3.9 meq/L (ref 3.7–5.3)
Sodium: 141 mEq/L (ref 137–147)
Total Bilirubin: 0.5 mg/dL (ref 0.3–1.2)
Total Protein: 7.8 g/dL (ref 6.0–8.3)

## 2013-06-03 LAB — ETHANOL

## 2013-06-03 LAB — LIPASE, BLOOD: LIPASE: 16 U/L (ref 11–59)

## 2013-06-03 LAB — POC URINE PREG, ED: PREG TEST UR: NEGATIVE

## 2013-06-03 MED ORDER — ONDANSETRON HCL 4 MG/2ML IJ SOLN
4.0000 mg | Freq: Once | INTRAMUSCULAR | Status: AC
  Administered 2013-06-03: 4 mg via INTRAVENOUS
  Filled 2013-06-03: qty 2

## 2013-06-03 MED ORDER — SODIUM CHLORIDE 0.9 % IV BOLUS (SEPSIS)
1000.0000 mL | Freq: Once | INTRAVENOUS | Status: AC
  Administered 2013-06-03: 1000 mL via INTRAVENOUS

## 2013-06-03 MED ORDER — ONDANSETRON 4 MG PO TBDP: 4.0000 mg | ORAL_TABLET | Freq: Three times a day (TID) | ORAL | Status: DC | PRN

## 2013-06-03 NOTE — ED Notes (Signed)
Per [facility] pt came from home with nausea and vomiting that started today with greater than 10 episodes of vomiting- clear liquid most recently. Pt denies diarrhea. Denies urinary or vaginal symptoms. VSS en route.

## 2013-06-03 NOTE — ED Provider Notes (Signed)
CSN: [id number]     Arrival date & time 06/03/13  8295 History   First MD Initiated Contact with Patient 06/03/13 1842     Chief Complaint  Patient presents with  . Nausea  . Emesis     (Consider location/radiation/quality/duration/timing/severity/associated sxs/prior Treatment) HPI Comments: Patient presents with nausea and vomiting. She states it started about 9:00 this morning has been ongoing. She denies any bloody emesis. She denies any diarrhea. She has some pain in her epigastrium. She does have a history of alcohol use. She states that she drinks several beers last night. She denies that she drinks daily she has been here to the ED before for alcohol intoxication. She states the emesis has been ongoing through the day and has a malar keep anything down.  Patient is a 28 y.o. female presenting with vomiting.  Emesis Associated symptoms: abdominal pain   Associated symptoms: no arthralgias, no chills, no diarrhea and no headaches     Past Medical History  Diagnosis Date  . Hypertension    History reviewed. No pertinent past surgical history. No family history on file. History  Substance Use Topics  . Smoking status: Current Every Day Smoker -- 1.00 packs/day  . Smokeless tobacco: Not on file  . Alcohol Use: Yes     Comment: beer 18 oz occational   OB History   Grav Para Term Preterm Abortions TAB SAB Ect Mult Living                 Review of Systems  Constitutional: Negative for fever, chills, diaphoresis and fatigue.  HENT: Negative for congestion, rhinorrhea and sneezing.   Eyes: Negative.   Respiratory: Negative for cough, chest tightness and shortness of breath.   Cardiovascular: Negative for chest pain and leg swelling.  Gastrointestinal: Positive for nausea, vomiting and abdominal pain. Negative for diarrhea and blood in stool.  Genitourinary: Negative for frequency, hematuria, flank pain and difficulty urinating.  Musculoskeletal: Negative for arthralgias and  back pain.  Skin: Negative for rash.  Neurological: Negative for dizziness, speech difficulty, weakness, numbness and headaches.      Allergies  Review of patient's allergies indicates no known allergies.  Home Medications   Current Outpatient Rx  Name  Route  Sig  Dispense  Refill  . cephALEXin (KEFLEX) 500 MG capsule   Oral   Take 1 capsule (500 mg total) by mouth 4 (four) times daily.   40 capsule   0   . ibuprofen (ADVIL,MOTRIN) 200 MG tablet   Oral   Take 600 mg by mouth every 6 (six) hours as needed for pain.         Marland Kitchen ondansetron (ZOFRAN) 4 MG tablet   Oral   Take 1 tablet (4 mg total) by mouth every 6 (six) hours.   12 tablet   0    BP 129/79  Pulse 56  Temp(Src) 98.1 F (36.7 C) (Oral)  Resp 16  SpO2 100%  LMP 05/13/2013 Physical Exam  Constitutional: She is oriented to person, place, and time. She appears well-developed and well-nourished.  HENT:  Head: Normocephalic and atraumatic.  Eyes: Pupils are equal, round, and reactive to light.  Neck: Normal range of motion. Neck supple.  Cardiovascular: Normal rate, regular rhythm and normal heart sounds.   Pulmonary/Chest: Effort normal and breath sounds normal. No respiratory distress. She has no wheezes. She has no rales. She exhibits no tenderness.  Abdominal: Soft. Bowel sounds are normal. There is tenderness (mild tenderness in the  epigastrium). There is no rebound and no guarding.  Musculoskeletal: Normal range of motion. She exhibits no edema.  Lymphadenopathy:    She has no cervical adenopathy.  Neurological: She is alert and oriented to person, place, and time.  Skin: Skin is warm and dry. No rash noted.  Psychiatric: She has a normal mood and affect.    ED Course  Procedures (including critical care time) Labs Review Labs Reviewed  CBC WITH DIFFERENTIAL  COMPREHENSIVE METABOLIC PANEL  LIPASE, BLOOD  ETHANOL   Imaging Review No results found.   EKG Interpretation None      MDM    Final diagnoses:  None    Pt feeling much better after IVFs, zofran.  No abd pain. Awaiting labs.  MLP to follow up on labs.    Rolan BuccoMelanie Ayaansh Smail, MD 06/03/13 2007

## 2013-06-03 NOTE — ED Provider Notes (Signed)
Patient care acquired from Dr. Fredderick PhenixBelfi. Patient with 1 day of acute onset nausea, vomiting, and epigastric pain that have improved while in ED. Labs pending. Plan to follow up on labs, PO challenge patient.  Physical Exam  BP 123/71  Pulse 70  Temp(Src) 98.1 F (36.7 C) (Oral)  Resp 16  SpO2 99%  LMP 05/13/2013  Physical Exam  Nursing note and vitals reviewed. Constitutional: She is oriented to person, place, and time. She appears well-developed and well-nourished. No distress.  HENT:  Head: Normocephalic and atraumatic.  Right Ear: External ear normal.  Left Ear: External ear normal.  Nose: Nose normal.  Eyes: Conjunctivae are normal.  Neck: Neck supple.  Cardiovascular: Normal rate, regular rhythm and normal heart sounds.   Pulmonary/Chest: Effort normal and breath sounds normal.  Abdominal: Soft. Bowel sounds are normal. She exhibits no distension. There is no tenderness. There is no rigidity, no rebound and no guarding.  Neurological: She is alert and oriented to person, place, and time.  Skin: Skin is warm and dry. She is not diaphoretic.    ED Course  Procedures  Medications  ondansetron (ZOFRAN) injection 4 mg (4 mg Intravenous Given 06/03/13 1910)  sodium chloride 0.9 % bolus 1,000 mL (1,000 mLs Intravenous New Bag/Given 06/03/13 1909)   Results for orders placed during the hospital encounter of 06/03/13  CBC WITH DIFFERENTIAL      Result Value Ref Range   WBC 14.9 (*) 4.0 - 10.5 K/uL   RBC 4.38  3.87 - 5.11 MIL/uL   Hemoglobin 14.7  12.0 - 15.0 g/dL   HCT 09.841.6  11.936.0 - 14.746.0 %   MCV 95.0  78.0 - 100.0 fL   MCH 33.6  26.0 - 34.0 pg   MCHC 35.3  30.0 - 36.0 g/dL   RDW 82.913.1  56.211.5 - 13.015.5 %   Platelets 210  150 - 400 K/uL   Neutrophils Relative % 80 (*) 43 - 77 %   Neutro Abs 12.0 (*) 1.7 - 7.7 K/uL   Lymphocytes Relative 14  12 - 46 %   Lymphs Abs 2.2  0.7 - 4.0 K/uL   Monocytes Relative 5  3 - 12 %   Monocytes Absolute 0.8  0.1 - 1.0 K/uL   Eosinophils Relative 0   0 - 5 %   Eosinophils Absolute 0.0  0.0 - 0.7 K/uL   Basophils Relative 0  0 - 1 %   Basophils Absolute 0.0  0.0 - 0.1 K/uL   No results found.   MDM  1. Nausea & vomiting     Filed Vitals:   06/03/13 2100  BP: 118/70  Pulse: 75  Temp:   Resp:     Afebrile, NAD, non-toxic appearing, AAOx4. On reevaluation patient endorses improvement of her symptoms. She is able to tolerate fluids without difficulty. Her abdomen is soft, nontender, nondistended. No peritoneal signs. Labs and urine reviewed. Will discharge patient home with symptomatic care and good return precautions. Patient is stable at time of discharge         Jeannetta EllisJennifer L Aaniyah Strohm, PA-C 06/03/13 2152

## 2013-06-03 NOTE — Discharge Instructions (Signed)
Please follow up with your primary care physician in 1-2 days. If you do not have one please call the [facility] number listed above. Please take Zofran as prescribed. Please read all discharge instructions and return precautions.    Nausea and Vomiting Nausea is a sick feeling that often comes before throwing up (vomiting). Vomiting is a reflex where stomach contents come out of your mouth. Vomiting can cause severe loss of body fluids (dehydration). Children and elderly adults can become dehydrated quickly, especially if they also have diarrhea. Nausea and vomiting are symptoms of a condition or disease. It is important to find the cause of your symptoms. CAUSES   Direct irritation of the stomach lining. This irritation can result from increased acid production (gastroesophageal reflux disease), infection, food poisoning, taking certain medicines (such as nonsteroidal anti-inflammatory drugs), alcohol use, or tobacco use.  Signals from the brain.These signals could be caused by a headache, heat exposure, an inner ear disturbance, increased pressure in the brain from injury, infection, a tumor, or a concussion, pain, emotional stimulus, or metabolic problems.  An obstruction in the gastrointestinal tract (bowel obstruction).  Illnesses such as diabetes, hepatitis, gallbladder problems, appendicitis, kidney problems, cancer, sepsis, atypical symptoms of a heart attack, or eating disorders.  Medical treatments such as chemotherapy and radiation.  Receiving medicine that makes you sleep (general anesthetic) during surgery. DIAGNOSIS Your caregiver may ask for tests to be done if the problems do not improve after a few days. Tests may also be done if symptoms are severe or if the reason for the nausea and vomiting is not clear. Tests may include:  Urine tests.  Blood tests.  Stool tests.  Cultures (to look for evidence of infection).  X-rays or other imaging  studies. Test results can help your caregiver make decisions about treatment or the need for additional tests. TREATMENT You need to stay well hydrated. Drink frequently but in small amounts.You may wish to drink water, sports drinks, clear broth, or eat frozen ice pops or gelatin dessert to help stay hydrated.When you eat, eating slowly may help prevent nausea.There are also some antinausea medicines that may help prevent nausea. HOME CARE INSTRUCTIONS   Take all medicine as directed by your caregiver.  If you do not have an appetite, do not force yourself to eat. However, you must continue to drink fluids.  If you have an appetite, eat a normal diet unless your caregiver tells you differently.  Eat a variety of complex carbohydrates (rice, wheat, potatoes, bread), lean meats, yogurt, fruits, and vegetables.  Avoid high-fat foods because they are more difficult to digest.  Drink enough water and fluids to keep your urine clear or pale yellow.  If you are dehydrated, ask your caregiver for specific rehydration instructions. Signs of dehydration may include:  Severe thirst.  Dry lips and mouth.  Dizziness.  Dark urine.  Decreasing urine frequency and amount.  Confusion.  Rapid breathing or pulse. SEEK IMMEDIATE MEDICAL CARE IF:   You have blood or Halt flecks (like coffee grounds) in your vomit.  You have black or bloody stools.  You have a severe headache or stiff neck.  You are confused.  You have severe abdominal pain.  You have chest pain or trouble breathing.  You do not urinate at least once every 8 hours.  You develop cold or clammy skin.  You continue to vomit for longer than 24 to 48 hours.  You have a fever. MAKE SURE YOU:  Understand these instructions.  Will watch your condition.  Will get help right away if you are not doing well or get worse. Document Released: 03/02/2005 Document Revised: 05/25/2011 Document Reviewed:  07/30/2010 [facility] Patient Information 2014 [place], [place].

## 2013-06-05 NOTE — ED Provider Notes (Signed)
Medical screening examination/treatment/procedure(s) were performed by non-physician practitioner and as supervising physician I was immediately available for consultation/collaboration.   EKG Interpretation None        Brittie Whisnant, MD 06/05/13 1909 

## 2013-10-22 ENCOUNTER — Encounter (HOSPITAL_COMMUNITY): Payer: Self-pay | Admitting: Emergency Medicine

## 2013-10-22 ENCOUNTER — Emergency Department (INDEPENDENT_AMBULATORY_CARE_PROVIDER_SITE_OTHER)
Admission: EM | Admit: 2013-10-22 | Discharge: 2013-10-22 | Disposition: A | Discharge status: Home / Self Care | Payer: Self-pay | Source: Home / Self Care | Attending: Family Medicine | Admitting: Family Medicine

## 2013-10-22 DIAGNOSIS — J020 Streptococcal pharyngitis: Secondary | ICD-10-CM

## 2013-10-22 MED ORDER — AMOXICILLIN 500 MG PO CAPS: 1000.0000 mg | ORAL_CAPSULE | Freq: Two times a day (BID) | ORAL | Status: DC

## 2013-10-22 NOTE — ED Notes (Signed)
Pt c/o sore throat, fever, and chills x 2 days. Patient reports she has also vomiting. Patient is alert and oriented and in no acute distress.

## 2013-10-22 NOTE — Discharge Instructions (Signed)
Thank you for coming in today. Take amoxicillin twice daily for 10 days.  You can also use up to 2 aleve twice daily for pain as needed.  Call or go to the emergency room if you get worse, have trouble breathing, have chest pains, or palpitations.   Strep Throat Strep throat is an infection of the throat caused by a bacteria named Streptococcus pyogenes. Your health care provider may call the infection streptococcal "tonsillitis" or "pharyngitis" depending on whether there are signs of inflammation in the tonsils or back of the throat. Strep throat is most common in children aged 5-15 years during the cold months of the year, but it can occur in people of any age during any season. This infection is spread from person to person (contagious) through coughing, sneezing, or other close contact. SIGNS AND SYMPTOMS   Fever or chills.  Painful, swollen, red tonsils or throat.  Pain or difficulty when swallowing.  White or yellow spots on the tonsils or throat.  Swollen, tender lymph nodes or "glands" of the neck or under the jaw.  Red rash all over the body (rare). DIAGNOSIS  Many different infections can cause the same symptoms. A test must be done to confirm the diagnosis so the right treatment can be given. A "rapid strep test" can help your health care provider make the diagnosis in a few minutes. If this test is not available, a light swab of the infected area can be used for a throat culture test. If a throat culture test is done, results are usually available in a day or two. TREATMENT  Strep throat is treated with antibiotic medicine. HOME CARE INSTRUCTIONS   Gargle with 1 tsp of salt in 1 cup of warm water, 3-4 times per day or as needed for comfort.  Family members who also have a sore throat or fever should be tested for strep throat and treated with antibiotics if they have the strep infection.  Make sure everyone in your household washes their hands well.  Do not share food,  drinking cups, or personal items that could cause the infection to spread to others.  You may need to eat a soft food diet until your sore throat gets better.  Drink enough water and fluids to keep your urine clear or pale yellow. This will help prevent dehydration.  Get plenty of rest.  Stay home from school, day care, or work until you have been on antibiotics for 24 hours.  Take medicines only as directed by your health care provider.  Take your antibiotic medicine as directed by your health care provider. Finish it even if you start to feel better. SEEK MEDICAL CARE IF:   The glands in your neck continue to enlarge.  You develop a rash, cough, or earache.  You cough up green, yellow-Oki, or bloody sputum.  You have pain or discomfort not controlled by medicines.  Your problems seem to be getting worse rather than better.  You have a fever. SEEK IMMEDIATE MEDICAL CARE IF:   You develop any new symptoms such as vomiting, severe headache, stiff or painful neck, chest pain, shortness of breath, or trouble swallowing.  You develop severe throat pain, drooling, or changes in your voice.  You develop swelling of the neck, or the skin on the neck becomes red and tender.  You develop signs of dehydration, such as fatigue, dry mouth, and decreased urination.  You become increasingly sleepy, or you cannot wake up completely. MAKE SURE YOU:  Understand these instructions.  Will watch your condition.  Will get help right away if you are not doing well or get worse. Document Released: 02/28/2000 Document Revised: 07/17/2013 Document Reviewed: 05/01/2010 [facility] Patient Information 2015 [place]. This information is not intended to replace advice given to you by your health care provider. Make sure you discuss any questions you have with your health care provider.

## 2013-10-22 NOTE — ED Provider Notes (Signed)
Denise Perry is a 28 y.o. female who presents to Urgent Care today for sore throat. Pt notes severe sore throat with fever. Symptoms present for 3 days. No NVD. Pain is worse with swallowing. Ibuprofen helps some. She feels well otherwise.   Past Medical History  Diagnosis Date  . Hypertension    History  Substance Use Topics  . Smoking status: Current Every Day Smoker -- 1.00 packs/day  . Smokeless tobacco: Not on file  . Alcohol Use: Yes     Comment: beer 18 oz occational   ROS as above Medications: No current facility-administered medications for this encounter.   Current Outpatient Prescriptions  Medication Sig Dispense Refill  . ibuprofen (ADVIL,MOTRIN) 200 MG tablet Take 600 mg by mouth every 6 (six) hours as needed for pain.      Marland Kitchen. amoxicillin (AMOXIL) 500 MG capsule Take 2 capsules (1,000 mg total) by mouth 2 (two) times daily.  40 capsule  0    Exam:  BP 133/84  Pulse 113  Temp(Src) 103.1 F (39.5 C) (Oral)  Resp 24  SpO2 97% Gen: Well NAD HEENT: EOMI,  MMM post pharynx is erythematous with exudate bl.  Lungs: Normal work of breathing. CTABL Heart: RRR no MRG Abd: NABS, Soft. Nondistended, Nontender Exts: Brisk capillary refill, warm and well perfused.   No results found for this or any previous visit (from the past 24 hour(s)). No results found.  Assessment and Plan: 28 y.o. female with Strep throat.  Plan to treat with amoxicillin and NSAIDs.  Discussed warning signs or symptoms. Please see discharge instructions. Patient expresses understanding.   This note was created using Conservation officer, historic buildingsDragon voice recognition software. Any transcription errors are unintended.    Rodolph BongEvan S Aniesa Boback, MD 10/22/13 [phone]

## 2014-09-05 DIAGNOSIS — R112 Nausea with vomiting, unspecified: Secondary | ICD-10-CM | POA: Insufficient documentation

## 2014-09-05 DIAGNOSIS — R42 Dizziness and giddiness: Secondary | ICD-10-CM | POA: Insufficient documentation

## 2014-09-05 DIAGNOSIS — R1010 Upper abdominal pain, unspecified: Secondary | ICD-10-CM | POA: Insufficient documentation

## 2014-09-05 DIAGNOSIS — I10 Essential (primary) hypertension: Secondary | ICD-10-CM | POA: Insufficient documentation

## 2014-09-05 DIAGNOSIS — Z720 Tobacco use: Secondary | ICD-10-CM | POA: Insufficient documentation

## 2014-09-05 NOTE — ED Notes (Signed)
Per EMS: pt coming from home with c/o upper abdominal pain for one day. Last BM today and normal. Pt reports nausea and emesis x 4. Medics did not witness pt vomit.

## 2014-09-06 ENCOUNTER — Emergency Department (HOSPITAL_COMMUNITY)
Admission: EM | Admit: 2014-09-06 | Discharge: 2014-09-06 | Discharge status: Against Medical Advice | Payer: Self-pay | Attending: Emergency Medicine | Admitting: Emergency Medicine

## 2014-09-06 ENCOUNTER — Encounter (HOSPITAL_COMMUNITY): Payer: Self-pay | Admitting: *Deleted

## 2014-09-06 LAB — CBC WITH DIFFERENTIAL/PLATELET
Basophils Absolute: 0 10*3/uL (ref 0.0–0.1)
Basophils Relative: 0 % (ref 0–1)
EOS PCT: 0 % (ref 0–5)
Eosinophils Absolute: 0.1 10*3/uL (ref 0.0–0.7)
HCT: 40 % (ref 36.0–46.0)
Hemoglobin: 13.7 g/dL (ref 12.0–15.0)
LYMPHS ABS: 2.3 10*3/uL (ref 0.7–4.0)
Lymphocytes Relative: 18 % (ref 12–46)
MCH: 32.5 pg (ref 26.0–34.0)
MCHC: 34.3 g/dL (ref 30.0–36.0)
MCV: 94.8 fL (ref 78.0–100.0)
Monocytes Absolute: 0.6 10*3/uL (ref 0.1–1.0)
Monocytes Relative: 4 % (ref 3–12)
NEUTROS ABS: 9.7 10*3/uL — AB (ref 1.7–7.7)
Neutrophils Relative %: 78 % — ABNORMAL HIGH (ref 43–77)
PLATELETS: 242 10*3/uL (ref 150–400)
RBC: 4.22 MIL/uL (ref 3.87–5.11)
RDW: 12.8 % (ref 11.5–15.5)
WBC: 12.7 10*3/uL — AB (ref 4.0–10.5)

## 2014-09-06 LAB — URINE MICROSCOPIC-ADD ON

## 2014-09-06 LAB — COMPREHENSIVE METABOLIC PANEL
ALT: 12 U/L — AB (ref 14–54)
AST: 18 U/L (ref 15–41)
Albumin: 4.3 g/dL (ref 3.5–5.0)
Alkaline Phosphatase: 75 U/L (ref 38–126)
Anion gap: 10 (ref 5–15)
BUN: 8 mg/dL (ref 6–20)
CO2: 25 mmol/L (ref 22–32)
Calcium: 9.8 mg/dL (ref 8.9–10.3)
Chloride: 107 mmol/L (ref 101–111)
Creatinine, Ser: 0.86 mg/dL (ref 0.44–1.00)
GFR calc Af Amer: >60 mL/min (ref >60)
GFR calc non Af Amer: >60 mL/min (ref >60)
Glucose, Bld: 102 mg/dL — ABNORMAL HIGH (ref 65–99)
Potassium: 3.8 mmol/L (ref 3.5–5.1)
Sodium: 142 mmol/L (ref 135–145)
TOTAL PROTEIN: 7.6 g/dL (ref 6.5–8.1)
Total Bilirubin: 0.8 mg/dL (ref 0.3–1.2)

## 2014-09-06 LAB — URINALYSIS, ROUTINE W REFLEX MICROSCOPIC
Bilirubin Urine: NEGATIVE
Glucose, UA: NEGATIVE mg/dL
KETONES UR: NEGATIVE mg/dL
Leukocytes, UA: NEGATIVE
Nitrite: NEGATIVE
PROTEIN: NEGATIVE mg/dL
Specific Gravity, Urine: 1.02 (ref 1.005–1.030)
UROBILINOGEN UA: 1 mg/dL (ref 0.0–1.0)
pH: 6.5 (ref 5.0–8.0)

## 2014-09-06 LAB — PREGNANCY, URINE: Preg Test, Ur: NEGATIVE

## 2014-09-06 LAB — LIPASE, BLOOD: Lipase: 15 U/L — ABNORMAL LOW (ref 22–51)

## 2014-09-06 MED ORDER — ONDANSETRON 4 MG PO TBDP
8.0000 mg | ORAL_TABLET | Freq: Once | ORAL | Status: AC
  Administered 2014-09-06: 8 mg via ORAL

## 2014-09-06 MED ORDER — ONDANSETRON 4 MG PO TBDP
ORAL_TABLET | ORAL | Status: AC
  Filled 2014-09-06: qty 2

## 2014-09-06 NOTE — ED Notes (Signed)
Pt reports n/v, dizziness, and feeling hot for one day. Pt states she feels dehydrated.

## 2014-09-06 NOTE — ED Notes (Signed)
Called patient in waiting room no answer.  

## 2014-09-06 NOTE — ED Notes (Signed)
Patient called in ED waiting room no answer to be place in a room.

## 2015-05-23 ENCOUNTER — Encounter (HOSPITAL_COMMUNITY): Payer: Self-pay | Admitting: Emergency Medicine

## 2015-05-23 ENCOUNTER — Emergency Department (HOSPITAL_COMMUNITY)
Admission: EM | Admit: 2015-05-23 | Discharge: 2015-05-23 | Disposition: A | Discharge status: Home / Self Care | Payer: Self-pay | Attending: Emergency Medicine | Admitting: Emergency Medicine

## 2015-05-23 DIAGNOSIS — F10129 Alcohol abuse with intoxication, unspecified: Secondary | ICD-10-CM | POA: Insufficient documentation

## 2015-05-23 DIAGNOSIS — F17200 Nicotine dependence, unspecified, uncomplicated: Secondary | ICD-10-CM | POA: Insufficient documentation

## 2015-05-23 DIAGNOSIS — I10 Essential (primary) hypertension: Secondary | ICD-10-CM | POA: Insufficient documentation

## 2015-05-23 NOTE — ED Notes (Signed)
Patient called for vital sign recheck with no response. 

## 2015-05-23 NOTE — ED Notes (Signed)
Per EMS-states N/V from drinking to much last night

## 2015-11-19 ENCOUNTER — Emergency Department (HOSPITAL_COMMUNITY)
Admission: EM | Admit: 2015-11-19 | Discharge: 2015-11-19 | Disposition: A | Discharge status: Home / Self Care | Payer: Self-pay | Attending: Dermatology | Admitting: Dermatology

## 2015-11-19 ENCOUNTER — Encounter (HOSPITAL_COMMUNITY): Payer: Self-pay

## 2015-11-19 DIAGNOSIS — Z791 Long term (current) use of non-steroidal anti-inflammatories (NSAID): Secondary | ICD-10-CM | POA: Insufficient documentation

## 2015-11-19 DIAGNOSIS — I10 Essential (primary) hypertension: Secondary | ICD-10-CM | POA: Insufficient documentation

## 2015-11-19 DIAGNOSIS — F419 Anxiety disorder, unspecified: Secondary | ICD-10-CM | POA: Insufficient documentation

## 2015-11-19 DIAGNOSIS — Z5321 Procedure and treatment not carried out due to patient leaving prior to being seen by health care provider: Secondary | ICD-10-CM | POA: Insufficient documentation

## 2015-11-19 LAB — CBC
HCT: 42.2 % (ref 36.0–46.0)
Hemoglobin: 14.6 g/dL (ref 12.0–15.0)
MCH: 31.8 pg (ref 26.0–34.0)
MCHC: 34.6 g/dL (ref 30.0–36.0)
MCV: 91.9 fL (ref 78.0–100.0)
PLATELETS: 263 10*3/uL (ref 150–400)
RBC: 4.59 MIL/uL (ref 3.87–5.11)
RDW: 13.6 % (ref 11.5–15.5)
WBC: 19.1 10*3/uL — AB (ref 4.0–10.5)

## 2015-11-19 LAB — COMPREHENSIVE METABOLIC PANEL
ALT: 15 U/L (ref 14–54)
ANION GAP: 8 (ref 5–15)
AST: 18 U/L (ref 15–41)
Albumin: 4.6 g/dL (ref 3.5–5.0)
Alkaline Phosphatase: 89 U/L (ref 38–126)
BUN: 9 mg/dL (ref 6–20)
CALCIUM: 9.6 mg/dL (ref 8.9–10.3)
CHLORIDE: 110 mmol/L (ref 101–111)
CO2: 22 mmol/L (ref 22–32)
CREATININE: 0.84 mg/dL (ref 0.44–1.00)
Glucose, Bld: 97 mg/dL (ref 65–99)
Potassium: 4.1 mmol/L (ref 3.5–5.1)
SODIUM: 140 mmol/L (ref 135–145)
Total Bilirubin: 0.7 mg/dL (ref 0.3–1.2)
Total Protein: 7.7 g/dL (ref 6.5–8.1)

## 2015-11-19 LAB — LIPASE, BLOOD: LIPASE: 17 U/L (ref 11–51)

## 2015-11-19 NOTE — ED Triage Notes (Signed)
Pt reports that she cannot stop vomiting. Pt also c/o headache.

## 2015-11-19 NOTE — ED Triage Notes (Signed)
Final call for pt with no response from lobby.

## 2015-11-19 NOTE — ED Triage Notes (Signed)
No answer from pt when called from lobby for a third time.

## 2015-11-19 NOTE — ED Notes (Signed)
No answer from pt when called from lobby x 1.  

## 2015-11-19 NOTE — ED Triage Notes (Signed)
Pt transported from home by EMS with c/o increased anxiety after taking several shots of Vodka 30 minutes ago. A & O[facility]

## 2015-12-07 ENCOUNTER — Encounter (HOSPITAL_COMMUNITY): Payer: Self-pay

## 2015-12-07 ENCOUNTER — Emergency Department (HOSPITAL_COMMUNITY)
Admission: EM | Admit: 2015-12-07 | Discharge: 2015-12-07 | Disposition: A | Discharge status: Home / Self Care | Payer: Self-pay | Attending: Dermatology | Admitting: Dermatology

## 2015-12-07 DIAGNOSIS — F17200 Nicotine dependence, unspecified, uncomplicated: Secondary | ICD-10-CM | POA: Insufficient documentation

## 2015-12-07 DIAGNOSIS — F419 Anxiety disorder, unspecified: Secondary | ICD-10-CM | POA: Insufficient documentation

## 2015-12-07 DIAGNOSIS — I10 Essential (primary) hypertension: Secondary | ICD-10-CM | POA: Insufficient documentation

## 2015-12-07 DIAGNOSIS — Z5321 Procedure and treatment not carried out due to patient leaving prior to being seen by health care provider: Secondary | ICD-10-CM | POA: Insufficient documentation

## 2015-12-07 HISTORY — DX: Anxiety disorder, unspecified: F41.9

## 2015-12-07 LAB — CBC
HCT: 43.2 % (ref 36.0–46.0)
HEMOGLOBIN: 14.6 g/dL (ref 12.0–15.0)
MCH: 32.1 pg (ref 26.0–34.0)
MCHC: 33.8 g/dL (ref 30.0–36.0)
MCV: 94.9 fL (ref 78.0–100.0)
PLATELETS: 206 10*3/uL (ref 150–400)
RBC: 4.55 MIL/uL (ref 3.87–5.11)
RDW: 13.8 % (ref 11.5–15.5)
WBC: 9.7 10*3/uL (ref 4.0–10.5)

## 2015-12-07 LAB — COMPREHENSIVE METABOLIC PANEL
ALT: 16 U/L (ref 14–54)
ANION GAP: 10 (ref 5–15)
AST: 24 U/L (ref 15–41)
Albumin: 4.3 g/dL (ref 3.5–5.0)
Alkaline Phosphatase: 88 U/L (ref 38–126)
BUN: 7 mg/dL (ref 6–20)
CHLORIDE: 109 mmol/L (ref 101–111)
CO2: 22 mmol/L (ref 22–32)
CREATININE: 0.84 mg/dL (ref 0.44–1.00)
Calcium: 9.4 mg/dL (ref 8.9–10.3)
Glucose, Bld: 98 mg/dL (ref 65–99)
POTASSIUM: 3.9 mmol/L (ref 3.5–5.1)
Sodium: 141 mmol/L (ref 135–145)
Total Bilirubin: 0.8 mg/dL (ref 0.3–1.2)
Total Protein: 7.6 g/dL (ref 6.5–8.1)

## 2015-12-07 LAB — I-STAT BETA HCG BLOOD, ED (MC, WL, AP ONLY): I-stat hCG, quantitative: <5 m[IU]/mL (ref <5)

## 2015-12-07 LAB — LIPASE, BLOOD: LIPASE: 17 U/L (ref 11–51)

## 2015-12-07 NOTE — ED Notes (Signed)
Called to reassess V/S and no answer

## 2015-12-07 NOTE — ED Notes (Signed)
Called pt to go to treatment room, no response

## 2015-12-07 NOTE — ED Triage Notes (Signed)
Patient complains of anxiety attack with vomiting x 4. Denies abdominal pain, denies cramping. States that she feels as if her heart has irregular beat when she has her anxiety attack. VSS, NAD

## 2016-05-11 ENCOUNTER — Encounter (HOSPITAL_COMMUNITY): Payer: Self-pay | Admitting: Emergency Medicine

## 2016-05-11 ENCOUNTER — Emergency Department (HOSPITAL_COMMUNITY): Payer: Self-pay

## 2016-05-11 ENCOUNTER — Emergency Department (HOSPITAL_COMMUNITY)
Admission: EM | Admit: 2016-05-11 | Discharge: 2016-05-11 | Disposition: A | Discharge status: Home / Self Care | Payer: Self-pay | Attending: Emergency Medicine | Admitting: Emergency Medicine

## 2016-05-11 DIAGNOSIS — I10 Essential (primary) hypertension: Secondary | ICD-10-CM | POA: Insufficient documentation

## 2016-05-11 DIAGNOSIS — F17200 Nicotine dependence, unspecified, uncomplicated: Secondary | ICD-10-CM | POA: Insufficient documentation

## 2016-05-11 DIAGNOSIS — F410 Panic disorder [episodic paroxysmal anxiety] without agoraphobia: Secondary | ICD-10-CM | POA: Insufficient documentation

## 2016-05-11 DIAGNOSIS — Z79899 Other long term (current) drug therapy: Secondary | ICD-10-CM | POA: Insufficient documentation

## 2016-05-11 DIAGNOSIS — R0789 Other chest pain: Secondary | ICD-10-CM | POA: Insufficient documentation

## 2016-05-11 LAB — BASIC METABOLIC PANEL
ANION GAP: 13 (ref 5–15)
BUN: 11 mg/dL (ref 6–20)
CHLORIDE: 100 mmol/L — AB (ref 101–111)
CO2: 24 mmol/L (ref 22–32)
Calcium: 9.9 mg/dL (ref 8.9–10.3)
Creatinine, Ser: 0.99 mg/dL (ref 0.44–1.00)
GFR calc non Af Amer: >60 mL/min (ref >60)
Glucose, Bld: 117 mg/dL — ABNORMAL HIGH (ref 65–99)
Potassium: 3.7 mmol/L (ref 3.5–5.1)
Sodium: 137 mmol/L (ref 135–145)

## 2016-05-11 LAB — CBC
HCT: 41.2 % (ref 36.0–46.0)
HEMOGLOBIN: 13.9 g/dL (ref 12.0–15.0)
MCH: 31.5 pg (ref 26.0–34.0)
MCHC: 33.7 g/dL (ref 30.0–36.0)
MCV: 93.4 fL (ref 78.0–100.0)
Platelets: 224 10*3/uL (ref 150–400)
RBC: 4.41 MIL/uL (ref 3.87–5.11)
RDW: 13.5 % (ref 11.5–15.5)
WBC: 9.9 10*3/uL (ref 4.0–10.5)

## 2016-05-11 LAB — I-STAT TROPONIN, ED: Troponin i, poc: 0 ng/mL (ref 0.00–0.08)

## 2016-05-11 MED ORDER — LORAZEPAM 1 MG PO TABS
1.0000 mg | ORAL_TABLET | Freq: Once | ORAL | Status: AC
  Administered 2016-05-11: 1 mg via ORAL
  Filled 2016-05-11: qty 1

## 2016-05-11 MED ORDER — LORAZEPAM 1 MG PO TABS: 1.0000 mg | ORAL_TABLET | Freq: Three times a day (TID) | ORAL | 0 refills | Status: DC | PRN

## 2016-05-11 NOTE — ED Notes (Signed)
Pt verbalized understanding of d/c instructions and has no further questions. Pt stable and NAD. Pt's anxiety is gone. Pt d/c home in cab.

## 2016-05-11 NOTE — ED Provider Notes (Signed)
[facility]EMERGENCY DEPT Provider Note   CSN: [id number] Arrival date & time: 05/11/16  [id number]     History   Chief Complaint Chief Complaint  Patient presents with  . Chest Pain  . Shortness of Breath    HPI Denise Perry is a 31 y.o. female.  HPI  This a 31 year old female who presents with chest pain and anxiety. Patient states that she wakes up every night with anxiety and panic attacks attacks. She describes her symptoms as chest pressure, shortness of breath. Many times she is able to take deep breaths and symptoms will resolve. She has talked to her primary physician who prescribed Vistaril. She states that this is not helping. He states tonight that her symptoms got worse so she came to the emergency room. Denies any recent hospitalizations, leg swelling, recent travel. He rates her chest pressure of 4 out of 10. Denies any recent fevers or cough. States that her symptoms tonight are the same as they are every night.  Past Medical History:  Diagnosis Date  . Anxiety   . Hypertension     There are no active problems to display for this patient.   History reviewed. No pertinent surgical history.  OB History    No data available       Home Medications    Prior to Admission medications   Medication Sig Start Date End Date Taking? Authorizing Provider  amoxicillin (AMOXIL) 500 MG capsule Take 2 capsules (1,000 mg total) by mouth 2 (two) times daily. 10/22/13   Rodolph BongEvan S Corey, MD  ibuprofen (ADVIL,MOTRIN) 200 MG tablet Take 600 mg by mouth every 6 (six) hours as needed for pain.    Historical Provider, MD  LORazepam (ATIVAN) 1 MG tablet Take 1 tablet (1 mg total) by mouth every 8 (eight) hours as needed for anxiety. 05/11/16   Shon Batonourtney F Horton, MD    Family History No family history on file.  Social History Social History  Substance Use Topics  . Smoking status: Current Every Day Smoker    Packs/day: 1.00  . Smokeless tobacco: Never Used  . Alcohol use Yes   Comment: socially      Allergies   Patient has no known allergies.   Review of Systems Review of Systems  Constitutional: Negative for fever.  Respiratory: Positive for shortness of breath. Negative for cough.   Cardiovascular: Positive for chest pain. Negative for leg swelling.  Gastrointestinal: Negative for abdominal pain, nausea and vomiting.  Psychiatric/Behavioral: The patient is nervous/anxious.   All other systems reviewed and are negative.    Physical Exam Updated Vital Signs BP 129/67   Pulse 75   Temp 98.7 F (37.1 C) (Oral)   Resp 18   Ht 5\' 3"  (1.6 m)   Wt 140 lb (63.5 kg)   LMP 04/12/2016   SpO2 97%   BMI 24.80 kg/m   Physical Exam  Constitutional: She is oriented to person, place, and time. She appears well-developed and well-nourished.  Anxious appearing, tearful, no acute distress  HENT:  Head: Normocephalic and atraumatic.  Cardiovascular: Normal rate, regular rhythm and normal heart sounds.   No murmur heard. Pulmonary/Chest: Effort normal. No respiratory distress. She has no wheezes.  Abdominal: Soft. Bowel sounds are normal. There is no tenderness.  Musculoskeletal: She exhibits no edema.  Neurological: She is alert and oriented to person, place, and time.  Skin: Skin is warm and dry.  Psychiatric:  Anxious appearing but nontoxic  Nursing note and vitals reviewed.  ED Treatments / Results  Labs (all labs ordered are listed, but only abnormal results are displayed) Labs Reviewed  BASIC METABOLIC PANEL - Abnormal; Notable for the following:       Result Value   Chloride 100 (*)    Glucose, Bld 117 (*)    All other components within normal limits  CBC  I-STAT TROPOININ, ED    EKG  EKG Interpretation  Date/Time:  Monday May 11 2016 04:47:44 EST Ventricular Rate:  84 PR Interval:  156 QRS Duration: 92 QT Interval:  396 QTC Calculation: 467 R Axis:   77 Text Interpretation:  Normal sinus rhythm Normal ECG Confirmed by  HORTON  MD, COURTNEY ([id number]) on 05/11/2016 4:45:43 AM       Radiology Dg Chest 2 View  Result Date: 05/11/2016 CLINICAL DATA:  Initial evaluation for acute chest pressure and tightness, shortness of breath. EXAM: CHEST  2 VIEW COMPARISON:  Prior radiograph from 11/21/2011. FINDINGS: The cardiac and mediastinal silhouettes are stable in size and contour, and remain within normal limits. The lungs are normally inflated. No airspace consolidation, pleural effusion, or pulmonary edema is identified. There is no pneumothorax. No acute osseous abnormality identified. IMPRESSION: No active cardiopulmonary disease. Electronically Signed   By: Rise Mu M.D.   On: 05/11/2016 05:40    Procedures Procedures (including critical care time)  Medications Ordered in ED Medications  LORazepam (ATIVAN) tablet 1 mg (1 mg Oral Given 05/11/16 0522)     Initial Impression / Assessment and Plan / ED Course  I have reviewed the triage vital signs and the nursing notes.  Pertinent labs & imaging results that were available during my care of the patient were reviewed by me and considered in my medical decision making (see chart for details).     Patient presents with chest pain in the setting of panic attacks. Reports that this happens nightly and symptoms are similar every night. However, tonight was worse. She is nontoxic. Very atypical and she is low risk ACS. EKG is normal. Initial troponin is negative. She is PERC negative. Patient was given Ativan with some improvement of her symptoms. Given that she has this nightly with similar symptoms, feel it likely is related to anxiety. She will be given a few pills of Ativan but needs to follow-up with her primary physician for further evaluation and management of her ongoing anxiety. Patient stated understanding.  After history, exam, and medical workup I feel the patient has been appropriately medically screened and is safe for discharge home. Pertinent  diagnoses were discussed with the patient. Patient was given return precautions.   Final Clinical Impressions(s) / ED Diagnoses   Final diagnoses:  Atypical chest pain  Panic attack    New Prescriptions New Prescriptions   LORAZEPAM (ATIVAN) 1 MG TABLET    Take 1 tablet (1 mg total) by mouth every 8 (eight) hours as needed for anxiety.     Shon Baton, MD 05/11/16 [phone]

## 2016-05-11 NOTE — ED Triage Notes (Addendum)
Pt reports center chest pressure with shortness of breath and feeling shaky that happens every night in her sleep. Symptoms have been ongoing for awhile.

## 2016-07-05 ENCOUNTER — Encounter (HOSPITAL_COMMUNITY): Payer: Self-pay | Admitting: Emergency Medicine

## 2016-07-05 ENCOUNTER — Emergency Department (HOSPITAL_COMMUNITY)
Admission: EM | Admit: 2016-07-05 | Discharge: 2016-07-05 | Disposition: A | Discharge status: Home / Self Care | Payer: Self-pay | Attending: Emergency Medicine | Admitting: Emergency Medicine

## 2016-07-05 DIAGNOSIS — Z79899 Other long term (current) drug therapy: Secondary | ICD-10-CM | POA: Insufficient documentation

## 2016-07-05 DIAGNOSIS — R51 Headache: Secondary | ICD-10-CM | POA: Insufficient documentation

## 2016-07-05 DIAGNOSIS — I10 Essential (primary) hypertension: Secondary | ICD-10-CM | POA: Insufficient documentation

## 2016-07-05 DIAGNOSIS — R519 Headache, unspecified: Secondary | ICD-10-CM

## 2016-07-05 DIAGNOSIS — F17200 Nicotine dependence, unspecified, uncomplicated: Secondary | ICD-10-CM | POA: Insufficient documentation

## 2016-07-05 HISTORY — DX: Migraine, unspecified, not intractable, without status migrainosus: G43.909

## 2016-07-05 MED ORDER — SODIUM CHLORIDE 0.9 % IV BOLUS (SEPSIS)
1000.0000 mL | Freq: Once | INTRAVENOUS | Status: AC
  Administered 2016-07-05: 1000 mL via INTRAVENOUS

## 2016-07-05 MED ORDER — DIPHENHYDRAMINE HCL 50 MG/ML IJ SOLN
25.0000 mg | Freq: Once | INTRAMUSCULAR | Status: AC
  Administered 2016-07-05: 25 mg via INTRAVENOUS
  Filled 2016-07-05: qty 1

## 2016-07-05 MED ORDER — KETOROLAC TROMETHAMINE 30 MG/ML IJ SOLN
30.0000 mg | Freq: Once | INTRAMUSCULAR | Status: AC
  Administered 2016-07-05: 30 mg via INTRAVENOUS
  Filled 2016-07-05: qty 1

## 2016-07-05 MED ORDER — METOCLOPRAMIDE HCL 5 MG/ML IJ SOLN
10.0000 mg | Freq: Once | INTRAMUSCULAR | Status: AC
  Administered 2016-07-05: 10 mg via INTRAVENOUS
  Filled 2016-07-05: qty 2

## 2016-07-05 NOTE — ED Provider Notes (Signed)
[facility] Provider Note   CSN: [id number] Arrival date & time: 07/05/16  [id number]     History   Chief Complaint Chief Complaint  Patient presents with  . Headache    HPI STARSHA MORNING is a 31 y.o. female.  Patient presents with bitemporal headache since last evening. No fever, chills, stiff neck, neurological deficits. She gets a rare headache, but this is unusual. She claims lots of stress at home. Severity of headache is moderate. Nothing makes symptoms better or worse.      Past Medical History:  Diagnosis Date  . Anxiety   . Hypertension   . Migraines     There are no active problems to display for this patient.   History reviewed. No pertinent surgical history.  OB History    No data available       Home Medications    Prior to Admission medications   Medication Sig Start Date End Date Taking? Authorizing Provider  amoxicillin (AMOXIL) 500 MG capsule Take 2 capsules (1,000 mg total) by mouth 2 (two) times daily. 10/22/13   Rodolph Bong, MD  ibuprofen (ADVIL,MOTRIN) 200 MG tablet Take 600 mg by mouth every 6 (six) hours as needed for pain.    Historical Provider, MD  LORazepam (ATIVAN) 1 MG tablet Take 1 tablet (1 mg total) by mouth every 8 (eight) hours as needed for anxiety. 05/11/16   Shon Baton, MD    Family History No family history on file.  Social History Social History  Substance Use Topics  . Smoking status: Current Every Day Smoker    Packs/day: 1.00  . Smokeless tobacco: Never Used  . Alcohol use Yes     Comment: socially      Allergies   Patient has no known allergies.   Review of Systems Review of Systems  All other systems reviewed and are negative.    Physical Exam Updated Vital Signs BP 135/77   Pulse 87   Temp 99.2 F (37.3 C) (Oral)   Resp 18   Ht  (1.6 m)   Wt 140 lb (63.5 kg)   SpO2 98%   BMI 24.80 kg/m   Physical Exam  Constitutional: She is oriented to person, place, and time.    Moderate distress.  HENT:  Head: Normocephalic and atraumatic.  Patient points to the bitemporal area as the source of her headache.  Eyes: Conjunctivae are normal.  Neck: Neck supple.  Cardiovascular: Normal rate and regular rhythm.   Pulmonary/Chest: Effort normal and breath sounds normal.  Abdominal: Soft. Bowel sounds are normal.  Musculoskeletal: Normal range of motion.  Neurological: She is alert and oriented to person, place, and time.  Skin: Skin is warm and dry.  Psychiatric: She has a normal mood and affect. Her behavior is normal.  Nursing note and vitals reviewed.    ED Treatments / Results  Labs (all labs ordered are listed, but only abnormal results are displayed) Labs Reviewed - No data to display  EKG  EKG Interpretation None       Radiology No results found.  Procedures Procedures (including critical care time)  Medications Ordered in ED Medications  sodium chloride 0.9 % bolus 1,000 mL (1,000 mLs Intravenous New Bag/Given 07/05/16 1017)  metoCLOPramide (REGLAN) injection 10 mg (10 mg Intravenous Given 07/05/16 1018)  diphenhydrAMINE (BENADRYL) injection 25 mg (25 mg Intravenous Given 07/05/16 1018)  ketorolac (TORADOL) 30 MG/ML injection 30 mg (30 mg Intravenous Given 07/05/16 1018)  Initial Impression / Assessment and Plan / ED Course  I have reviewed the triage vital signs and the nursing notes.  Pertinent labs & imaging results that were available during my care of the patient were reviewed by me and considered in my medical decision making (see chart for details).     Patient presents with bitemporal headache without neurological deficits. She responded well to IV hydration, IV Reglan, IV Benadryl, IV Toradol.  She is improved at discharge. Final Clinical Impressions(s) / ED Diagnoses   Final diagnoses:  Acute intractable headache, unspecified headache type    New Prescriptions New Prescriptions   No medications on file     Donnetta Hutching, MD 07/05/16 1237

## 2016-07-05 NOTE — ED Triage Notes (Signed)
History of migraines states headache yesterday continued today pain 10/10 throbbing alert answering and following commands appropriate.

## 2016-07-05 NOTE — Discharge Instructions (Signed)
Rest. Increase fluids. Can take Tylenol or ibuprofen at home.

## 2016-07-07 ENCOUNTER — Emergency Department (HOSPITAL_COMMUNITY)
Admission: EM | Admit: 2016-07-07 | Discharge: 2016-07-08 | Disposition: A | Discharge status: Home / Self Care | Payer: Self-pay | Attending: Dermatology | Admitting: Dermatology

## 2016-07-07 ENCOUNTER — Encounter (HOSPITAL_COMMUNITY): Payer: Self-pay | Admitting: Emergency Medicine

## 2016-07-07 DIAGNOSIS — Z5321 Procedure and treatment not carried out due to patient leaving prior to being seen by health care provider: Secondary | ICD-10-CM | POA: Insufficient documentation

## 2016-07-07 DIAGNOSIS — F419 Anxiety disorder, unspecified: Secondary | ICD-10-CM | POA: Insufficient documentation

## 2016-07-07 NOTE — ED Notes (Signed)
Called for room placement x1 

## 2016-07-07 NOTE — ED Triage Notes (Addendum)
Per EMS, patient from home, reports daily panic attacks x3 weeks. Reports panic attack began today and she took her prescribed vistaril at 6pm with no relief. Denies SI/HI. Ambulatory with EMS.

## 2016-07-07 NOTE — ED Notes (Signed)
Called for room placement x2. 

## 2016-11-07 ENCOUNTER — Encounter (HOSPITAL_COMMUNITY): Payer: Self-pay | Admitting: Emergency Medicine

## 2016-11-07 ENCOUNTER — Emergency Department (HOSPITAL_COMMUNITY)
Admission: EM | Admit: 2016-11-07 | Discharge: 2016-11-07 | Disposition: A | Discharge status: Home / Self Care | Payer: Self-pay | Attending: Emergency Medicine | Admitting: Emergency Medicine

## 2016-11-07 DIAGNOSIS — Z5321 Procedure and treatment not carried out due to patient leaving prior to being seen by health care provider: Secondary | ICD-10-CM | POA: Insufficient documentation

## 2016-11-07 NOTE — ED Notes (Signed)
Called 3X to recheck vitals-no response 

## 2016-11-07 NOTE — ED Triage Notes (Signed)
Pt reports increase of panic attacks lately, states hx of same. States "i just know it's anxiety." calm and cooperative in triage.

## 2016-11-07 NOTE — ED Notes (Signed)
Called x3 in lobby unable to find pt.

## 2016-11-18 ENCOUNTER — Emergency Department (HOSPITAL_COMMUNITY): Payer: Self-pay

## 2016-11-18 ENCOUNTER — Emergency Department (HOSPITAL_COMMUNITY)
Admission: EM | Admit: 2016-11-18 | Discharge: 2016-11-18 | Disposition: A | Discharge status: Home / Self Care | Payer: Self-pay | Attending: Emergency Medicine | Admitting: Emergency Medicine

## 2016-11-18 ENCOUNTER — Encounter (HOSPITAL_COMMUNITY): Payer: Self-pay | Admitting: Emergency Medicine

## 2016-11-18 DIAGNOSIS — F410 Panic disorder [episodic paroxysmal anxiety] without agoraphobia: Secondary | ICD-10-CM | POA: Insufficient documentation

## 2016-11-18 DIAGNOSIS — F17210 Nicotine dependence, cigarettes, uncomplicated: Secondary | ICD-10-CM | POA: Insufficient documentation

## 2016-11-18 MED ORDER — ALPRAZOLAM 0.5 MG PO TABS
0.5000 mg | ORAL_TABLET | Freq: Once | ORAL | Status: AC
  Administered 2016-11-18: 0.5 mg via ORAL
  Filled 2016-11-18: qty 1

## 2016-11-18 NOTE — ED Triage Notes (Signed)
[facility] EMS reports she has daily anxiety attacks with shortness of breath. This episode feels just like all the other attacks. Patient is ambulatory is from the EMS truck to triage with no signs of respiratory distress. Patient has two children with her.

## 2016-11-18 NOTE — ED Notes (Signed)
Patient transported to X-ray 

## 2016-11-18 NOTE — Discharge Instructions (Signed)
Follow up with your doctor for further evaluation and management of symptoms of anxiety and panic. Return here as needed.

## 2016-11-18 NOTE — ED Provider Notes (Signed)
[facility]-EMERGENCY DEPT Provider Note   CSN: [id number] Arrival date & time: 11/18/16  2133     History   Chief Complaint Chief Complaint  Patient presents with  . Anxiety    HPI Denise Perry is a 31 y.o. female.  Patient with long history of anxiety and panic presents after waking tonight with severe SOB and chest pain in the center of her chest. She admits the symptoms are the same as with episodes previously diagnosed as panic, but this one was much more severe. She feels better now with some residual center chest discomfort.  No recent cough or fever. No nausea, vomiting or diaphoresis. She takes Vistaril as prescribed without relief. "I know something is wrong with me".    The history is provided by the patient. No language interpreter was used.  Anxiety  This is a recurrent problem. Associated symptoms include chest pain and shortness of breath.    Past Medical History:  Diagnosis Date  . Anxiety   . Hypertension   . Migraines     There are no active problems to display for this patient.   History reviewed. No pertinent surgical history.  OB History    No data available       Home Medications    Prior to Admission medications   Medication Sig Start Date End Date Taking? Authorizing Provider  hydrOXYzine (VISTARIL) 25 MG capsule Take 25 mg by mouth every 8 (eight) hours as needed for anxiety.  07/29/15  Yes [provider]  amoxicillin (AMOXIL) 500 MG capsule Take 2 capsules (1,000 mg total) by mouth 2 (two) times daily. Patient not taking: Reported on 11/18/2016 10/22/13   Rodolph Bongorey, Evan S, MD  LORazepam (ATIVAN) 1 MG tablet Take 1 tablet (1 mg total) by mouth every 8 (eight) hours as needed for anxiety. Patient not taking: Reported on 11/18/2016 05/11/16   Horton, Mayer Maskerourtney F, MD    Family History No family history on file.  Social History Social History  Substance Use Topics  . Smoking status: Current Every Day Smoker    Packs/day: 1.00  . Smokeless  tobacco: Never Used  . Alcohol use Yes     Comment: socially      Allergies   Patient has no known allergies.   Review of Systems Review of Systems  Constitutional: Negative for chills, diaphoresis and fever.  HENT: Negative.   Respiratory: Positive for shortness of breath.   Cardiovascular: Positive for chest pain and palpitations.  Gastrointestinal: Negative.  Negative for nausea.  Musculoskeletal: Negative.   Skin: Negative.   Neurological:       Tingling into upper extremities.      Physical Exam Updated Vital Signs BP 123/86 (BP Location: Left Arm)   Pulse 86   Temp 98.8 F (37.1 C) (Oral)   Resp 19   Wt 65.8 kg (145 lb)   LMP 11/17/2016   SpO2 99%   BMI 25.69 kg/m   Physical Exam  Constitutional: She is oriented to person, place, and time. She appears well-developed and well-nourished.  HENT:  Head: Normocephalic.  Neck: Normal range of motion. Neck supple.  Cardiovascular: Normal rate and regular rhythm.   No murmur heard. No carotid bruit.  Pulmonary/Chest: Effort normal and breath sounds normal. She has no wheezes. She has no rales. She exhibits tenderness (Sternal chest wall tenderness. ).  Abdominal: Soft. Bowel sounds are normal. There is no tenderness. There is no rebound and no guarding.  Musculoskeletal: Normal range of motion.  Neurological: She is alert and oriented to person, place, and time.  Skin: Skin is warm and dry. No rash noted.  Psychiatric: She has a normal mood and affect.     ED Treatments / Results  Labs (all labs ordered are listed, but only abnormal results are displayed) Labs Reviewed - No data to display  EKG  EKG Interpretation None       Radiology No results found. Dg Chest 2 View  Result Date: 11/18/2016 CLINICAL DATA:  Chest pain EXAM: CHEST  2 VIEW COMPARISON:  05/11/2016 FINDINGS: The heart size and mediastinal contours are within normal limits. Both lungs are clear. The visualized skeletal structures are  unremarkable. IMPRESSION: No active cardiopulmonary disease. Electronically Signed   By: Jasmine Pang M.D.   On: 11/18/2016 23:14   Procedures Procedures (including critical care time)  Medications Ordered in ED Medications  ALPRAZolam (XANAX) tablet 0.5 mg (not administered)     Initial Impression / Assessment and Plan / ED Course  I have reviewed the triage vital signs and the nursing notes.  Pertinent labs & imaging results that were available during my care of the patient were reviewed by me and considered in my medical decision making (see chart for details).     Patient presents with symptoms of SOB, chest pain, palpitations, paresthesias, c/w longstanding history of panic attacks. She states she came via EMS because of the severity of her symptoms. She is currently feeling better, however, convinced that "there is something wrong".   EKG and CXR ordered to evaluate symptoms although presentation is c/w anxiety/panic. Xanax provided here for symptomatic relief.   CXR, EKG have no concerning changes. She can be discharged home to follow up with her PCP for ongoing evaluation and management.   Final Clinical Impressions(s) / ED Diagnoses   Final diagnoses:  None   1. Panic attack  New Prescriptions New Prescriptions   No medications on file     Danne Harbor 11/18/16 2320    Mancel Bale, MD 11/18/16 [phone]

## 2016-11-27 ENCOUNTER — Encounter (HOSPITAL_COMMUNITY): Payer: Self-pay

## 2016-11-27 ENCOUNTER — Emergency Department (HOSPITAL_COMMUNITY): Payer: Self-pay

## 2016-11-27 ENCOUNTER — Emergency Department (HOSPITAL_COMMUNITY)
Admission: EM | Admit: 2016-11-27 | Discharge: 2016-11-27 | Disposition: A | Discharge status: Home / Self Care | Payer: Self-pay | Attending: Emergency Medicine | Admitting: Emergency Medicine

## 2016-11-27 DIAGNOSIS — R079 Chest pain, unspecified: Secondary | ICD-10-CM

## 2016-11-27 DIAGNOSIS — R1110 Vomiting, unspecified: Secondary | ICD-10-CM | POA: Insufficient documentation

## 2016-11-27 DIAGNOSIS — F419 Anxiety disorder, unspecified: Secondary | ICD-10-CM | POA: Insufficient documentation

## 2016-11-27 DIAGNOSIS — I10 Essential (primary) hypertension: Secondary | ICD-10-CM | POA: Insufficient documentation

## 2016-11-27 DIAGNOSIS — F17200 Nicotine dependence, unspecified, uncomplicated: Secondary | ICD-10-CM | POA: Insufficient documentation

## 2016-11-27 DIAGNOSIS — R0789 Other chest pain: Secondary | ICD-10-CM | POA: Insufficient documentation

## 2016-11-27 LAB — CBC
HCT: 40.1 % (ref 36.0–46.0)
Hemoglobin: 13.4 g/dL (ref 12.0–15.0)
MCH: 30.5 pg (ref 26.0–34.0)
MCHC: 33.4 g/dL (ref 30.0–36.0)
MCV: 91.3 fL (ref 78.0–100.0)
Platelets: 220 10*3/uL (ref 150–400)
RBC: 4.39 MIL/uL (ref 3.87–5.11)
RDW: 13.8 % (ref 11.5–15.5)
WBC: 11.9 10*3/uL — ABNORMAL HIGH (ref 4.0–10.5)

## 2016-11-27 LAB — BASIC METABOLIC PANEL
Anion gap: 11 (ref 5–15)
BUN: 6 mg/dL (ref 6–20)
CALCIUM: 9.2 mg/dL (ref 8.9–10.3)
CO2: 21 mmol/L — ABNORMAL LOW (ref 22–32)
Chloride: 104 mmol/L (ref 101–111)
Creatinine, Ser: 0.86 mg/dL (ref 0.44–1.00)
GFR calc Af Amer: >60 mL/min (ref >60)
GFR calc non Af Amer: >60 mL/min (ref >60)
GLUCOSE: 106 mg/dL — AB (ref 65–99)
Potassium: 3.6 mmol/L (ref 3.5–5.1)
Sodium: 136 mmol/L (ref 135–145)

## 2016-11-27 LAB — I-STAT TROPONIN, ED: TROPONIN I, POC: 0 ng/mL (ref 0.00–0.08)

## 2016-11-27 MED ORDER — KETOROLAC TROMETHAMINE 30 MG/ML IJ SOLN
30.0000 mg | Freq: Once | INTRAMUSCULAR | Status: AC
  Administered 2016-11-27: 30 mg via INTRAVENOUS
  Filled 2016-11-27: qty 1

## 2016-11-27 MED ORDER — ONDANSETRON HCL 4 MG/2ML IJ SOLN
4.0000 mg | Freq: Once | INTRAMUSCULAR | Status: AC
  Administered 2016-11-27: 4 mg via INTRAVENOUS
  Filled 2016-11-27: qty 2

## 2016-11-27 MED ORDER — SODIUM CHLORIDE 0.9 % IV BOLUS (SEPSIS)
1000.0000 mL | Freq: Once | INTRAVENOUS | Status: AC
  Administered 2016-11-27: 1000 mL via INTRAVENOUS

## 2016-11-27 MED ORDER — ALPRAZOLAM 0.5 MG PO TABS: ORAL_TABLET | ORAL | 0 refills | Status: DC

## 2016-11-27 MED ORDER — LORAZEPAM 2 MG/ML IJ SOLN
1.0000 mg | Freq: Once | INTRAMUSCULAR | Status: AC
  Administered 2016-11-27: 1 mg via INTRAVENOUS
  Filled 2016-11-27: qty 1

## 2016-11-27 NOTE — Discharge Instructions (Signed)
Xanax as prescribed as needed for anxiety.  Follow-up with cardiology if your symptoms persist. The contact information for the [facility] cardiology clinic has been provided in this discharge summary for you to call and make these arrangements.  Return to the emergency department if your symptoms significantly worsen or change.

## 2016-11-27 NOTE — ED Provider Notes (Signed)
[facility]-EMERGENCY DEPT Provider Note   CSN: [id number] Arrival date & time: 11/27/16  4098     History   Chief Complaint Chief Complaint  Patient presents with  . Panic Attack  . Chest Pain    HPI Denise Perry is a 31 y.o. female.  Patient is a 31 year old female with past medical history of migraines, anxiety, and hypertension. She presents today for evaluation of chest pain and vomiting. This apparently woke her from sleep about 3 AM. She feels "dehydrated". She denies any diarrhea, fevers, or chills. She was seen at [facility] with a similar episode one week ago, however no cause was found. She was feeling better after receiving Xanax until her symptoms recurred this evening. She denies any prior cardiac history. She denies any difficulty breathing or swelling of the legs.   The history is provided by the patient.  Chest Pain   This is a recurrent problem. The current episode started less than 1 hour ago. The problem occurs constantly. The problem has not changed since onset.The pain is present in the substernal region. The pain is severe. The quality of the pain is described as sharp. The pain does not radiate. Pertinent negatives include no cough and no shortness of breath. She has tried nothing for the symptoms.  Pertinent negatives for past medical history include no CAD, no COPD, no CHF and no diabetes.    Past Medical History:  Diagnosis Date  . Anxiety   . Hypertension   . Migraines     There are no active problems to display for this patient.   History reviewed. No pertinent surgical history.  OB History    No data available       Home Medications    Prior to Admission medications   Medication Sig Start Date End Date Taking? Authorizing Provider  amoxicillin (AMOXIL) 500 MG capsule Take 2 capsules (1,000 mg total) by mouth 2 (two) times daily. Patient not taking: Reported on 11/18/2016 10/22/13   Rodolph Bong, MD  hydrOXYzine (VISTARIL) 25 MG capsule Take  25 mg by mouth every 8 (eight) hours as needed for anxiety.  07/29/15   [provider]  LORazepam (ATIVAN) 1 MG tablet Take 1 tablet (1 mg total) by mouth every 8 (eight) hours as needed for anxiety. Patient not taking: Reported on 11/18/2016 05/11/16   Horton, Mayer Masker, MD    Family History No family history on file.  Social History Social History  Substance Use Topics  . Smoking status: Current Every Day Smoker    Packs/day: 1.00  . Smokeless tobacco: Never Used  . Alcohol use Yes     Comment: socially      Allergies   Patient has no known allergies.   Review of Systems Review of Systems  Respiratory: Negative for cough and shortness of breath.   Cardiovascular: Positive for chest pain.  All other systems reviewed and are negative.    Physical Exam Updated Vital Signs BP 130/79   Pulse 79   Temp 98.7 F (37.1 C)   Resp (!) 22   Ht  (1.6 m)   Wt 63.5 kg (140 lb)   LMP 11/12/2016 (Approximate)   SpO2 99%   BMI 24.80 kg/m   Physical Exam  Constitutional: She is oriented to person, place, and time. She appears well-developed and well-nourished. No distress.  HENT:  Head: Normocephalic and atraumatic.  Neck: Normal range of motion. Neck supple.  Cardiovascular: Normal rate and regular rhythm.  Exam reveals no gallop and no friction rub.   No murmur heard. Pulmonary/Chest: Effort normal and breath sounds normal. No respiratory distress. She has no wheezes. She exhibits tenderness.  There is tenderness of the anterior chest wall that seems to reproduce her symptoms.  Abdominal: Soft. Bowel sounds are normal. She exhibits no distension. There is no tenderness.  Musculoskeletal: Normal range of motion. She exhibits no edema.  There is noted edema or calf tenderness, and Homans sign is absent bilaterally.  Neurological: She is alert and oriented to person, place, and time.  Skin: Skin is warm and dry. She is not diaphoretic.  Nursing note and vitals  reviewed.    ED Treatments / Results  Labs (all labs ordered are listed, but only abnormal results are displayed) Labs Reviewed  BASIC METABOLIC PANEL  CBC  I-STAT TROPONIN, ED    EKG  EKG Interpretation  Date/Time:  Friday November 27 2016 05:23:26 EDT Ventricular Rate:  75 PR Interval:    QRS Duration: 88 QT Interval:  402 QTC Calculation: 449 R Axis:   65 Text Interpretation:  Sinus rhythm Normal sinus rhythm Confirmed by Geoffery Lyons ([id number]) on 11/27/2016 5:42:49 AM       Radiology Dg Chest 2 View  Result Date: 11/27/2016 CLINICAL DATA:  Chest pain.  Cough. EXAM: CHEST  2 VIEW COMPARISON:  Radiograph 11/18/2016 FINDINGS: The cardiomediastinal contours are normal. The lungs are clear. Pulmonary vasculature is normal. No consolidation, pleural effusion, or pneumothorax. No acute osseous abnormalities are seen. IMPRESSION: Unremarkable radiographs of the chest. Electronically Signed   By: Rubye Oaks M.D.   On: 11/27/2016 05:39    Procedures Procedures (including critical care time)  Medications Ordered in ED Medications  ondansetron (ZOFRAN) injection 4 mg (not administered)  ketorolac (TORADOL) 30 MG/ML injection 30 mg (not administered)  sodium chloride 0.9 % bolus 1,000 mL (not administered)  LORazepam (ATIVAN) injection 1 mg (not administered)     Initial Impression / Assessment and Plan / ED Course  I have reviewed the triage vital signs and the nursing notes.  Pertinent labs & imaging results that were available during my care of the patient were reviewed by me and considered in my medical decision making (see chart for details).  Patient with chest pain that woke her from sleep. It was sharp in nature and she reports hyperventilation with numbness of her hands and feet and face. I highly suspect an anxiety component as she is feeling better after receiving Ativan. Her workup reveals no evidence for a cardiac etiology or other intrathoracic pathology.  She will be discharged with Xanax and advised to follow-up with cardiology if her symptoms persist to discuss a stress test.  Final Clinical Impressions(s) / ED Diagnoses   Final diagnoses:  None    New Prescriptions New Prescriptions   No medications on file     Geoffery Lyons, MD 11/27/16 [phone]

## 2016-11-27 NOTE — ED Triage Notes (Signed)
Pt comes via [facility], woke up with burning CP for the past hour, hx of panic attacks and gerd. Some n/v prior to EMS arrival.

## 2016-11-28 ENCOUNTER — Encounter (HOSPITAL_COMMUNITY): Payer: Self-pay | Admitting: Emergency Medicine

## 2016-11-28 ENCOUNTER — Emergency Department (HOSPITAL_COMMUNITY)
Admission: EM | Admit: 2016-11-28 | Discharge: 2016-11-28 | Disposition: A | Discharge status: Home / Self Care | Payer: Self-pay | Attending: Emergency Medicine | Admitting: Emergency Medicine

## 2016-11-28 DIAGNOSIS — F17210 Nicotine dependence, cigarettes, uncomplicated: Secondary | ICD-10-CM | POA: Insufficient documentation

## 2016-11-28 DIAGNOSIS — F419 Anxiety disorder, unspecified: Secondary | ICD-10-CM | POA: Insufficient documentation

## 2016-11-28 MED ORDER — LORAZEPAM 1 MG PO TABS
1.0000 mg | ORAL_TABLET | Freq: Once | ORAL | Status: AC
  Administered 2016-11-28: 1 mg via ORAL
  Filled 2016-11-28: qty 1

## 2016-11-28 MED ORDER — HYDROXYZINE HCL 25 MG PO TABS: 25.0000 mg | ORAL_TABLET | Freq: Three times a day (TID) | ORAL | 0 refills | Status: DC | PRN

## 2016-11-28 NOTE — ED Provider Notes (Signed)
[facility]-EMERGENCY DEPT Provider Note   CSN: [id number] Arrival date & time: 11/28/16  2141     History   Chief Complaint Chief Complaint  Patient presents with  . Anxiety    HPI Denise Perry is a 31 y.o. female.  Patient is a 31 year old female with a history of anxiety, hypertension and migraines who presents with an anxiety attack. She states she woke up from sleeping and had a sudden onset of anxiety with associated chest pain and shortness of breath. She was seen yesterday for similar symptoms and got a prescription for Xanax but was not able to get it filled today. She states he symptoms are similar to her prior anxiety attacks. She denies any suicidal ideations. She denies any specific triggers. She denies any cough congestion or other recent illnesses.      Past Medical History:  Diagnosis Date  . Anxiety   . Hypertension   . Migraines     There are no active problems to display for this patient.   History reviewed. No pertinent surgical history.  OB History    No data available       Home Medications    Prior to Admission medications   Medication Sig Start Date End Date Taking? Authorizing Provider  ALPRAZolam Prudy Feeler) 0.5 MG tablet Take 1 tablet every 6 hours as needed for chest pain/anxiety. Patient not taking: Reported on 11/28/2016 11/27/16   Geoffery Lyons, MD  amoxicillin (AMOXIL) 500 MG capsule Take 2 capsules (1,000 mg total) by mouth 2 (two) times daily. Patient not taking: Reported on 11/18/2016 10/22/13   Rodolph Bong, MD  hydrOXYzine (ATARAX/VISTARIL) 25 MG tablet Take 1 tablet (25 mg total) by mouth every 8 (eight) hours as needed for anxiety. 11/28/16   Rolan Bucco, MD  LORazepam (ATIVAN) 1 MG tablet Take 1 tablet (1 mg total) by mouth every 8 (eight) hours as needed for anxiety. Patient not taking: Reported on 11/18/2016 05/11/16   Horton, Mayer Masker, MD    Family History History reviewed. No pertinent family history.  Social History Social  History  Substance Use Topics  . Smoking status: Current Every Day Smoker    Packs/day: 1.00  . Smokeless tobacco: Never Used  . Alcohol use Yes     Comment: socially      Allergies   Patient has no known allergies.   Review of Systems Review of Systems  Constitutional: Negative for chills, diaphoresis, fatigue and fever.  HENT: Negative for congestion, rhinorrhea and sneezing.   Eyes: Negative.   Respiratory: Negative for cough, chest tightness and shortness of breath.   Cardiovascular: Negative for chest pain and leg swelling.  Gastrointestinal: Negative for abdominal pain, blood in stool, diarrhea, nausea and vomiting.  Genitourinary: Negative for difficulty urinating, flank pain, frequency and hematuria.  Musculoskeletal: Negative for arthralgias and back pain.  Skin: Negative for rash.  Neurological: Negative for dizziness, speech difficulty, weakness, numbness and headaches.  Psychiatric/Behavioral: The patient is nervous/anxious.      Physical Exam Updated Vital Signs BP 122/79 (BP Location: Left Arm)   Pulse 76   Temp 98.6 F (37 C) (Oral)   Resp 18   Ht  (1.6 m)   Wt 63.5 kg (140 lb)   LMP 11/17/2016   SpO2 96%   BMI 24.80 kg/m   Physical Exam  Constitutional: She is oriented to person, place, and time. She appears well-developed and well-nourished.  HENT:  Head: Normocephalic and atraumatic.  Eyes: Pupils are equal,  round, and reactive to light.  Neck: Normal range of motion. Neck supple.  Cardiovascular: Normal rate, regular rhythm and normal heart sounds.   Pulmonary/Chest: Effort normal and breath sounds normal. No respiratory distress. She has no wheezes. She has no rales. She exhibits no tenderness.  Abdominal: Soft. Bowel sounds are normal. There is no tenderness. There is no rebound and no guarding.  Musculoskeletal: Normal range of motion. She exhibits no edema.  Lymphadenopathy:    She has no cervical adenopathy.  Neurological: She is  alert and oriented to person, place, and time.  Skin: Skin is warm and dry. No rash noted.  Psychiatric: She has a normal mood and affect.     ED Treatments / Results  Labs (all labs ordered are listed, but only abnormal results are displayed) Labs Reviewed - No data to display  EKG  EKG Interpretation None       Radiology Dg Chest 2 View  Result Date: 11/27/2016 CLINICAL DATA:  Chest pain.  Cough. EXAM: CHEST  2 VIEW COMPARISON:  Radiograph 11/18/2016 FINDINGS: The cardiomediastinal contours are normal. The lungs are clear. Pulmonary vasculature is normal. No consolidation, pleural effusion, or pneumothorax. No acute osseous abnormalities are seen. IMPRESSION: Unremarkable radiographs of the chest. Electronically Signed   By: Rubye Oaks M.D.   On: 11/27/2016 05:39    Procedures Procedures (including critical care time)  Medications Ordered in ED Medications  LORazepam (ATIVAN) tablet 1 mg (1 mg Oral Given 11/28/16 2246)     Initial Impression / Assessment and Plan / ED Course  I have reviewed the triage vital signs and the nursing notes.  Pertinent labs & imaging results that were available during my care of the patient were reviewed by me and considered in my medical decision making (see chart for details).     Patient is a 31 year old female who presents with an anxiety attack. She was given dose of Ativan and feels 100% better. She did have some chest pain shortness of breath which was similar to her symptoms of panic attacks in the past. She was seen yesterday for the same symptoms and had an EKG which did not show any ischemic changes. I did not feel that this needed to be repeated today. She was discharged home in good condition. She requests a prescription for Atarax as this has helped her anxiety in the past. She's been unable to fill the Xanax prescription. She was given resource guide for possible outpatient follow-up.  Final Clinical Impressions(s) / ED  Diagnoses   Final diagnoses:  Anxiety    New Prescriptions New Prescriptions   HYDROXYZINE (ATARAX/VISTARIL) 25 MG TABLET    Take 1 tablet (25 mg total) by mouth every 8 (eight) hours as needed for anxiety.     Rolan Bucco, MD 11/28/16 [phone]

## 2016-11-28 NOTE — ED Triage Notes (Addendum)
Patient son called EMS due to patient having an anxiety attack. Patient does not take any medication for the anxiety. Patient walked to EMS truck with no problems per EMS. Patient has no other symptoms. Patient states she can not afford to get xanax that was prescribed yesterday.

## 2016-12-03 ENCOUNTER — Emergency Department (HOSPITAL_COMMUNITY)
Admission: EM | Admit: 2016-12-03 | Discharge: 2016-12-04 | Disposition: A | Discharge status: Home / Self Care | Payer: Self-pay | Attending: Emergency Medicine | Admitting: Emergency Medicine

## 2016-12-03 ENCOUNTER — Encounter (HOSPITAL_COMMUNITY): Payer: Self-pay | Admitting: Emergency Medicine

## 2016-12-03 DIAGNOSIS — R51 Headache: Secondary | ICD-10-CM | POA: Insufficient documentation

## 2016-12-03 DIAGNOSIS — Z5321 Procedure and treatment not carried out due to patient leaving prior to being seen by health care provider: Secondary | ICD-10-CM | POA: Insufficient documentation

## 2016-12-03 LAB — LIPASE, BLOOD: LIPASE: 19 U/L (ref 11–51)

## 2016-12-03 LAB — COMPREHENSIVE METABOLIC PANEL
ALT: 13 U/L — AB (ref 14–54)
AST: 17 U/L (ref 15–41)
Albumin: 4.8 g/dL (ref 3.5–5.0)
Alkaline Phosphatase: 108 U/L (ref 38–126)
Anion gap: 10 (ref 5–15)
BUN: 9 mg/dL (ref 6–20)
CHLORIDE: 106 mmol/L (ref 101–111)
CO2: 24 mmol/L (ref 22–32)
CREATININE: 0.75 mg/dL (ref 0.44–1.00)
Calcium: 9.7 mg/dL (ref 8.9–10.3)
GFR calc non Af Amer: >60 mL/min (ref >60)
Glucose, Bld: 92 mg/dL (ref 65–99)
Potassium: 4 mmol/L (ref 3.5–5.1)
SODIUM: 140 mmol/L (ref 135–145)
Total Bilirubin: 0.8 mg/dL (ref 0.3–1.2)
Total Protein: 8 g/dL (ref 6.5–8.1)

## 2016-12-03 LAB — CBC
HCT: 43 % (ref 36.0–46.0)
Hemoglobin: 14.6 g/dL (ref 12.0–15.0)
MCH: 31.3 pg (ref 26.0–34.0)
MCHC: 34 g/dL (ref 30.0–36.0)
MCV: 92.3 fL (ref 78.0–100.0)
PLATELETS: 253 10*3/uL (ref 150–400)
RBC: 4.66 MIL/uL (ref 3.87–5.11)
RDW: 14.2 % (ref 11.5–15.5)
WBC: 11 10*3/uL — ABNORMAL HIGH (ref 4.0–10.5)

## 2016-12-03 LAB — I-STAT BETA HCG BLOOD, ED (MC, WL, AP ONLY): I-stat hCG, quantitative: <5 m[IU]/mL (ref <5)

## 2016-12-03 NOTE — ED Notes (Signed)
Bed: WA05 Expected date:  Expected time:  Means of arrival:  Comments: 

## 2016-12-03 NOTE — ED Triage Notes (Signed)
Per EMS pt complaint hangover with associated headache, nausea, and vomiting. Denies n/v at present time.

## 2017-04-20 ENCOUNTER — Encounter (HOSPITAL_COMMUNITY): Payer: Self-pay | Admitting: Emergency Medicine

## 2017-04-20 ENCOUNTER — Emergency Department (HOSPITAL_COMMUNITY)
Admission: EM | Admit: 2017-04-20 | Discharge: 2017-04-20 | Discharge status: Against Medical Advice | Payer: Self-pay | Attending: Emergency Medicine | Admitting: Emergency Medicine

## 2017-04-20 DIAGNOSIS — R1084 Generalized abdominal pain: Secondary | ICD-10-CM | POA: Insufficient documentation

## 2017-04-20 DIAGNOSIS — Z5321 Procedure and treatment not carried out due to patient leaving prior to being seen by health care provider: Secondary | ICD-10-CM | POA: Insufficient documentation

## 2017-04-20 LAB — URINALYSIS, ROUTINE W REFLEX MICROSCOPIC
BILIRUBIN URINE: NEGATIVE
GLUCOSE, UA: NEGATIVE mg/dL
Ketones, ur: 20 mg/dL — AB
NITRITE: NEGATIVE
PH: 5 (ref 5.0–8.0)
Protein, ur: 100 mg/dL — AB
SPECIFIC GRAVITY, URINE: 1.03 (ref 1.005–1.030)

## 2017-04-20 LAB — POC URINE PREG, ED: PREG TEST UR: NEGATIVE

## 2017-04-20 NOTE — ED Notes (Signed)
Patient called x 2 with no answer 

## 2017-04-20 NOTE — ED Triage Notes (Signed)
Pt states for the last 2 days she has had lower abd with cramping and painful intercourse. Pt states she just finished her menstrual period yesterday reports no usual discharge.

## 2017-04-22 ENCOUNTER — Encounter (HOSPITAL_COMMUNITY): Payer: Self-pay | Admitting: Emergency Medicine

## 2017-04-22 ENCOUNTER — Emergency Department (HOSPITAL_COMMUNITY): Payer: Self-pay

## 2017-04-22 ENCOUNTER — Other Ambulatory Visit: Payer: Self-pay

## 2017-04-22 ENCOUNTER — Emergency Department (HOSPITAL_COMMUNITY)
Admission: EM | Admit: 2017-04-22 | Discharge: 2017-04-22 | Disposition: A | Discharge status: Home / Self Care | Payer: Self-pay | Attending: Emergency Medicine | Admitting: Emergency Medicine

## 2017-04-22 DIAGNOSIS — I10 Essential (primary) hypertension: Secondary | ICD-10-CM | POA: Insufficient documentation

## 2017-04-22 DIAGNOSIS — N839 Noninflammatory disorder of ovary, fallopian tube and broad ligament, unspecified: Secondary | ICD-10-CM | POA: Insufficient documentation

## 2017-04-22 DIAGNOSIS — R102 Pelvic and perineal pain: Secondary | ICD-10-CM | POA: Insufficient documentation

## 2017-04-22 DIAGNOSIS — F17210 Nicotine dependence, cigarettes, uncomplicated: Secondary | ICD-10-CM | POA: Insufficient documentation

## 2017-04-22 DIAGNOSIS — Z5321 Procedure and treatment not carried out due to patient leaving prior to being seen by health care provider: Secondary | ICD-10-CM | POA: Insufficient documentation

## 2017-04-22 DIAGNOSIS — N838 Other noninflammatory disorders of ovary, fallopian tube and broad ligament: Secondary | ICD-10-CM

## 2017-04-22 DIAGNOSIS — F419 Anxiety disorder, unspecified: Secondary | ICD-10-CM | POA: Insufficient documentation

## 2017-04-22 DIAGNOSIS — R109 Unspecified abdominal pain: Secondary | ICD-10-CM | POA: Insufficient documentation

## 2017-04-22 DIAGNOSIS — Z79899 Other long term (current) drug therapy: Secondary | ICD-10-CM | POA: Insufficient documentation

## 2017-04-22 LAB — COMPREHENSIVE METABOLIC PANEL
ALK PHOS: 82 U/L (ref 38–126)
ALT: 10 U/L — AB (ref 14–54)
AST: 13 U/L — AB (ref 15–41)
Albumin: 4 g/dL (ref 3.5–5.0)
Anion gap: 9 (ref 5–15)
BILIRUBIN TOTAL: 0.2 mg/dL — AB (ref 0.3–1.2)
BUN: 12 mg/dL (ref 6–20)
CO2: 24 mmol/L (ref 22–32)
CREATININE: 0.7 mg/dL (ref 0.44–1.00)
Calcium: 9.3 mg/dL (ref 8.9–10.3)
Chloride: 106 mmol/L (ref 101–111)
GFR calc Af Amer: >60 mL/min (ref >60)
GFR calc non Af Amer: >60 mL/min (ref >60)
Glucose, Bld: 113 mg/dL — ABNORMAL HIGH (ref 65–99)
Potassium: 3.6 mmol/L (ref 3.5–5.1)
Sodium: 139 mmol/L (ref 135–145)
TOTAL PROTEIN: 7.6 g/dL (ref 6.5–8.1)

## 2017-04-22 LAB — URINALYSIS, ROUTINE W REFLEX MICROSCOPIC
Bacteria, UA: NONE SEEN
Bilirubin Urine: NEGATIVE
Glucose, UA: NEGATIVE mg/dL
Ketones, ur: 5 mg/dL — AB
Nitrite: NEGATIVE
PROTEIN: NEGATIVE mg/dL
Specific Gravity, Urine: 1.021 (ref 1.005–1.030)
pH: 6 (ref 5.0–8.0)

## 2017-04-22 LAB — CBC
HCT: 38.7 % (ref 36.0–46.0)
Hemoglobin: 13.5 g/dL (ref 12.0–15.0)
MCH: 32.1 pg (ref 26.0–34.0)
MCHC: 34.9 g/dL (ref 30.0–36.0)
MCV: 92.1 fL (ref 78.0–100.0)
PLATELETS: 260 10*3/uL (ref 150–400)
RBC: 4.2 MIL/uL (ref 3.87–5.11)
RDW: 13.8 % (ref 11.5–15.5)
WBC: 14.8 10*3/uL — AB (ref 4.0–10.5)

## 2017-04-22 LAB — WET PREP, GENITAL
CLUE CELLS WET PREP: NONE SEEN
Sperm: NONE SEEN
Yeast Wet Prep HPF POC: NONE SEEN

## 2017-04-22 LAB — I-STAT BETA HCG BLOOD, ED (MC, WL, AP ONLY): I-stat hCG, quantitative: <5 m[IU]/mL (ref <5)

## 2017-04-22 LAB — LIPASE, BLOOD: Lipase: 23 U/L (ref 11–51)

## 2017-04-22 MED ORDER — OXYCODONE-ACETAMINOPHEN 5-325 MG PO TABS
1.0000 | ORAL_TABLET | Freq: Once | ORAL | Status: AC
  Administered 2017-04-22: 1 via ORAL
  Filled 2017-04-22: qty 1

## 2017-04-22 MED ORDER — METRONIDAZOLE 500 MG PO TABS: 500.0000 mg | ORAL_TABLET | Freq: Two times a day (BID) | ORAL | 0 refills | Status: DC

## 2017-04-22 MED ORDER — METRONIDAZOLE 500 MG PO TABS
500.0000 mg | ORAL_TABLET | Freq: Once | ORAL | Status: AC
Start: 2017-04-22 — End: 2017-04-22
  Administered 2017-04-22: 500 mg via ORAL
  Filled 2017-04-22: qty 1

## 2017-04-22 MED ORDER — IOPAMIDOL (ISOVUE-300) INJECTION 61%
INTRAVENOUS | Status: AC
  Administered 2017-04-22: 100 mL
  Filled 2017-04-22: qty 100

## 2017-04-22 MED ORDER — AMOXICILLIN-POT CLAVULANATE 875-125 MG PO TABS: 1.0000 | ORAL_TABLET | Freq: Two times a day (BID) | ORAL | 0 refills | Status: DC

## 2017-04-22 MED ORDER — OXYCODONE-ACETAMINOPHEN 5-325 MG PO TABS: 1.0000 | ORAL_TABLET | Freq: Three times a day (TID) | ORAL | 0 refills | Status: DC | PRN

## 2017-04-22 MED ORDER — AMOXICILLIN-POT CLAVULANATE 875-125 MG PO TABS
1.0000 | ORAL_TABLET | Freq: Once | ORAL | Status: AC
  Administered 2017-04-22: 1 via ORAL
  Filled 2017-04-22: qty 1

## 2017-04-22 NOTE — ED Notes (Signed)
Pt will be next to be moved back to Pod A.

## 2017-04-22 NOTE — ED Notes (Addendum)
Attempted to collect patient's blood x1 unsuccessfully. Patient requesting no more blood work be drawn.

## 2017-04-22 NOTE — ED Notes (Signed)
Pelvic cart set up and out side of room 

## 2017-04-22 NOTE — ED Triage Notes (Signed)
[place] nurse called, the patient is in their lobby

## 2017-04-22 NOTE — ED Triage Notes (Signed)
Patient is complaining of abdominal pain. Patient states she had a bowel movement today. Patient has had the abdominal pain for three days.

## 2017-04-22 NOTE — ED Triage Notes (Addendum)
Pt arrives via taxi for pelvic pain and constipation for several days, this is her third ED visit for same but pt continues to leave prior to being seen by MD. Denies vaginal discharge/odor. Reports last bowel movement today, reports "not all of it came out." Midal not effective for pain. Pt given narcotic pain medicine in triage. Advised of side effects and instructed to avoid driving for a minimum of four hours.

## 2017-04-22 NOTE — ED Notes (Signed)
Pt arrives via taxi [facility], states she was waiting to be seen there but couldn't wait any longer. Informed patient about wait time in ED at this time. Pt willing to be seen.

## 2017-04-22 NOTE — ED Notes (Signed)
To US

## 2017-04-22 NOTE — Discharge Instructions (Signed)
Someone from Dr. Danne HarborHarraway-Smith's office will contact you to schedule you for an appointment on Monday.  Take 1 tablet of Flagyl (metronidazole) 2 times daily for the next 7 days.  Take 1 tablet of Augmentin twice daily for the next 7 days.  Your first dose of both of these antibiotics have been given to you in the emergency department tonight.  It is important to take all of these tablets even if your pain starts to improve.  For severe pain, take 1 tablet of Percocet every 8 hours as needed.  Do not work or drive while taking this medication because it can cause drowsiness and cause you to be impaired.  It is also a narcotic and can be addicting.  Please only use for severe pain. For mild to moderate pain, take 800 mg of ibuprofen with food every 8 hours or thousand milligrams of Tylenol every 8 hours.  If you develop uncontrollable pain, vomiting, high fever despite taking Tylenol, or other new concerning symptoms, it is important that you go to the emergency department at [facility] for reevaluation.

## 2017-04-22 NOTE — ED Notes (Signed)
Requested urine

## 2017-04-22 NOTE — ED Notes (Signed)
Explained the delay to the pt.  Pt asking about getting a pain pill while in the lobby.  Informed the pt that she is waiting to the see the doctor.

## 2017-04-22 NOTE — ED Provider Notes (Signed)
[facility] Note   CSN: [id number] Arrival date & time: 04/22/17  0444     History   Chief Complaint Chief Complaint  Patient presents with  . Constipation  . Pelvic Pain    HPI Denise Perry is a 32 y.o. female who presents to the emergency department with a chief complaint of bilateral lower abdominal pain that began 3 days ago.  The pain is constant, characterized as sharp and crampy, and radiates to her bilateral upper quadrants.  Pain is worse when she lays on her right or left side and after eating and drinking.  She reports 2 episodes of NBNB emesis and nausea this morning with 2 episodes of loose stools yesterday.  She states that she has not had to strain to have a BM, but when she attempts to have a BM or void that she has increased pressure in the bilateral lower abdomen.  She reports associated dyspareunia.  She reports that she had sexual intercourse yesterday for the first time in several months.   She reports that she had similar pain that was much milder last month that seemed to improve after taking Midol and Pepto-Bismol around the time of her menstrual cycle.  LMP 04/16/2017, which ended yesterday.   She denies fever, chills, back pain, hematuria, dysuria, hematochezia, melena, hematemesis, chest pain, dyspnea, rash, vaginal pain, itching, or discharge.  She attempted to come to the emergency department 2 times over the last 2 days, but left due to wait time.  She was given a Percocet around 5 AM, which significantly improved her symptoms.  She has also been treating her symptoms with Pepto-Bismol and Midol with no improvement.  No known sick contacts.  No history of abdominal surgery.  The history is provided by the patient. No language interpreter was used.    Past Medical History:  Diagnosis Date  . Anxiety   . Hypertension   . Migraines     There are no active problems to display for this patient.   History  reviewed. No pertinent surgical history.  OB History    No data available       Home Medications    Prior to Admission medications   Medication Sig Start Date End Date Taking? Authorizing Provider  ALPRAZolam Prudy Feeler(XANAX) 0.5 MG tablet Take 1 tablet every 6 hours as needed for chest pain/anxiety. Patient not taking: Reported on 11/28/2016 11/27/16   Geoffery Lyonselo, Douglas, MD  amoxicillin (AMOXIL) 500 MG capsule Take 2 capsules (1,000 mg total) by mouth 2 (two) times daily. Patient not taking: Reported on 11/18/2016 10/22/13   Rodolph Bongorey, Evan S, MD  amoxicillin-clavulanate (AUGMENTIN) 875-125 MG tablet Take 1 tablet by mouth every 12 (twelve) hours. 04/22/17   Anneke Cundy A, PA-C  hydrOXYzine (ATARAX/VISTARIL) 25 MG tablet Take 1 tablet (25 mg total) by mouth every 8 (eight) hours as needed for anxiety. 11/28/16   Rolan BuccoBelfi, Melanie, MD  LORazepam (ATIVAN) 1 MG tablet Take 1 tablet (1 mg total) by mouth every 8 (eight) hours as needed for anxiety. Patient not taking: Reported on 11/18/2016 05/11/16   Horton, Mayer Maskerourtney F, MD  metroNIDAZOLE (FLAGYL) 500 MG tablet Take 1 tablet (500 mg total) by mouth 2 (two) times daily. 04/22/17   Lenya Sterne A, PA-C  oxyCODONE-acetaminophen (PERCOCET/ROXICET) 5-325 MG tablet Take 1 tablet by mouth every 8 (eight) hours as needed for severe pain. 04/22/17   Augustino Savastano, Coral ElseMia A, PA-C    Family History History reviewed. No pertinent  family history.  Social History Social History   Tobacco Use  . Smoking status: Current Every Day Smoker    Packs/day: 1.00  . Smokeless tobacco: Never Used  Substance Use Topics  . Alcohol use: Yes    Comment: socially   . Drug use: No     Allergies   Patient has no known allergies.   Review of Systems Review of Systems  Constitutional: Negative for activity change, chills and fever.  HENT: Negative for congestion.   Eyes: Negative for visual disturbance.  Respiratory: Negative for shortness of breath.   Cardiovascular: Negative for chest pain.   Gastrointestinal: Positive for diarrhea, nausea and vomiting. Negative for abdominal pain.  Genitourinary: Positive for dyspareunia. Negative for dysuria, flank pain, frequency, hematuria, urgency, vaginal discharge and vaginal pain.  Musculoskeletal: Negative for back pain.  Skin: Negative for rash.  Allergic/Immunologic: Negative for immunocompromised state.  Neurological: Negative for dizziness and light-headedness.  Hematological: Does not bruise/bleed easily.   Physical Exam Updated Vital Signs BP 114/79   Pulse 87   Temp 98.3 F (36.8 C) (Oral)   Resp 16   Ht 5\' 3"  (1.6 m)   Wt 63.5 kg (140 lb)   LMP 04/13/2017   SpO2 100%   BMI 24.80 kg/m   Physical Exam  Constitutional: She appears well-developed and well-nourished. No distress.  HENT:  Head: Normocephalic.  Eyes: Conjunctivae are normal.  Neck: Neck supple.  Cardiovascular: Normal rate and regular rhythm. Exam reveals no gallop and no friction rub.  No murmur heard. Pulmonary/Chest: Effort normal. No stridor. No respiratory distress. She has no wheezes. She has no rales. She exhibits no tenderness.  Abdominal: Soft. She exhibits no distension and no mass. There is tenderness. There is rebound and guarding. No hernia.  She is moderately tender to palpation in the left lower quadrant, right lower quadrant, and right upper quadrant with mild guarding.  She has rebound with palpation in the left lower quadrant.  Negative Murphy sign.  She is tender over McBurney's point.  No organomegaly or masses.  No CVA tenderness bilaterally.  Abdomen is soft, and nondistended.  Genitourinary: Cervix exhibits motion tenderness and discharge. Right adnexum displays mass, tenderness and fullness. Left adnexum displays mass, tenderness and fullness. There is erythema in the vagina.  Genitourinary Comments: Positive for cervical motion tenderness.  She has enlarged adnexa bilaterally.  Yellow, malodorous discharge is noted in the vaginal  vault.  Neurological: She is alert.  Skin: Skin is warm. Capillary refill takes less than 2 seconds. No rash noted. She is not diaphoretic.  Psychiatric: Her behavior is normal.  Nursing note and vitals reviewed.  ED Treatments / Results  Labs (all labs ordered are listed, but only abnormal results are displayed) Labs Reviewed  WET PREP, GENITAL - Abnormal; Notable for the following components:      Result Value   Trich, Wet Prep PRESENT (*)    WBC, Wet Prep HPF POC MANY (*)    All other components within normal limits  URINALYSIS, ROUTINE W REFLEX MICROSCOPIC - Abnormal; Notable for the following components:   Hgb urine dipstick MODERATE (*)    Ketones, ur 5 (*)    Leukocytes, UA MODERATE (*)    Squamous Epithelial / LPF 0-5 (*)    All other components within normal limits  GC/CHLAMYDIA PROBE AMP ([place]) NOT AT [facility]    EKG  EKG Interpretation None       Radiology US Transvaginal Non-ob  Result Date: 04/22/2017  CLINICAL DATA:  Pelvic pain, abnormal CT scan demonstrating abnormal fluid collections in the RIGHT adnexa question cystic ovarian mass, multiple cysts or tubo-ovarian abscess EXAM: TRANSABDOMINAL AND TRANSVAGINAL ULTRASOUND OF PELVIS DOPPLER ULTRASOUND OF OVARIES TECHNIQUE: Both transabdominal and transvaginal ultrasound examinations of the pelvis were performed. Transabdominal technique was performed for global imaging of the pelvis including uterus, ovaries, adnexal regions, and pelvic cul-de-sac. It was necessary to proceed with endovaginal exam following the transabdominal exam to visualize the RIGHT ovary and adnexa. Color and duplex Doppler ultrasound was utilized to evaluate blood flow to the ovaries. Transvaginal imaging was terminated by the patient prior to completion of RIGHT adnexal assessment secondary to pain. COMPARISON:  CT abdomen and pelvis 04/22/2017 FINDINGS: Uterus Measurements: 10.5 x 5.0 x 5.4 cm.  Normal morphology without mass. Endometrium  Thickness: 2 mm thick.  No endometrial fluid or focal abnormality Right ovary No definite normal appearing RIGHT ovarian tissue is identified, see below. Left ovary Measurements: 2.5 x 4.9 x 3.1 cm.  Normal morphology without mass Pulsed Doppler evaluation of both ovaries demonstrates venous and low resistance arterial waveforms within the LEFT ovary. Normal appearing RIGHT ovary not visualized. Other findings No abnormal free fluid. In the LEFT adnexa, a complex cystic collection is identified, somewhat elongated, 6.8 x 5.7 x 4.5 cm, question complex cystic mass versus tubo-ovarian abscess. RIGHT adnexal imaging was not performed on transvaginal imaging due to patient termination of procedure but on transabdominal imaging multiple rounded cystic collections are identified, on certain if representing multiple ovarian cysts or tubo-ovarian abscess. No normal appearing RIGHT ovary visualized. In aggregate the above complex RIGHT adnexal collection measures 6.2 x 10.5 x 4.9 cm and demonstrates presence of internal blood flow on color Doppler imaging within the tissue/septations between the loculations, with presence of venous and low resistance arterial waveforms on duplex sonography. IMPRESSION: Unremarkable uterus and LEFT ovary. Complex fluid collection in LEFT adnexa, somewhat elongated, question tubo-ovarian abscess. Incomplete assessment of the RIGHT adnexa on transvaginal imaging due to patient termination of exam. Nonvisualization of a normal appearing RIGHT ovary. Complex multiloculated collection in the RIGHT adnexa 6.2 x 10.5 x 4.9 cm question multiple cystic lesions within RIGHT ovary versus single multiloculated cystic lesion of the RIGHT ovary versus tubo-ovarian abscess. Unable to definitively assess for RIGHT ovarian torsion due to failure of identification of the RIGHT ovary. Low resistance arterial and venous waveforms are seen within the soft tissue between the loculated collections in the RIGHT  adnexa on duplex sonography. Electronically Signed   By: Ulyses Southward M.D.   On: 04/22/2017 19:44   US Pelvis Complete  Result Date: 04/22/2017 CLINICAL DATA:  Pelvic pain, abnormal CT scan demonstrating abnormal fluid collections in the RIGHT adnexa question cystic ovarian mass, multiple cysts or tubo-ovarian abscess EXAM: TRANSABDOMINAL AND TRANSVAGINAL ULTRASOUND OF PELVIS DOPPLER ULTRASOUND OF OVARIES TECHNIQUE: Both transabdominal and transvaginal ultrasound examinations of the pelvis were performed. Transabdominal technique was performed for global imaging of the pelvis including uterus, ovaries, adnexal regions, and pelvic cul-de-sac. It was necessary to proceed with endovaginal exam following the transabdominal exam to visualize the RIGHT ovary and adnexa. Color and duplex Doppler ultrasound was utilized to evaluate blood flow to the ovaries. Transvaginal imaging was terminated by the patient prior to completion of RIGHT adnexal assessment secondary to pain. COMPARISON:  CT abdomen and pelvis 04/22/2017 FINDINGS: Uterus Measurements: 10.5 x 5.0 x 5.4 cm.  Normal morphology without mass. Endometrium Thickness: 2 mm thick.  No endometrial fluid or focal abnormality  Right ovary No definite normal appearing RIGHT ovarian tissue is identified, see below. Left ovary Measurements: 2.5 x 4.9 x 3.1 cm.  Normal morphology without mass Pulsed Doppler evaluation of both ovaries demonstrates venous and low resistance arterial waveforms within the LEFT ovary. Normal appearing RIGHT ovary not visualized. Other findings No abnormal free fluid. In the LEFT adnexa, a complex cystic collection is identified, somewhat elongated, 6.8 x 5.7 x 4.5 cm, question complex cystic mass versus tubo-ovarian abscess. RIGHT adnexal imaging was not performed on transvaginal imaging due to patient termination of procedure but on transabdominal imaging multiple rounded cystic collections are identified, on certain if representing multiple  ovarian cysts or tubo-ovarian abscess. No normal appearing RIGHT ovary visualized. In aggregate the above complex RIGHT adnexal collection measures 6.2 x 10.5 x 4.9 cm and demonstrates presence of internal blood flow on color Doppler imaging within the tissue/septations between the loculations, with presence of venous and low resistance arterial waveforms on duplex sonography. IMPRESSION: Unremarkable uterus and LEFT ovary. Complex fluid collection in LEFT adnexa, somewhat elongated, question tubo-ovarian abscess. Incomplete assessment of the RIGHT adnexa on transvaginal imaging due to patient termination of exam. Nonvisualization of a normal appearing RIGHT ovary. Complex multiloculated collection in the RIGHT adnexa 6.2 x 10.5 x 4.9 cm question multiple cystic lesions within RIGHT ovary versus single multiloculated cystic lesion of the RIGHT ovary versus tubo-ovarian abscess. Unable to definitively assess for RIGHT ovarian torsion due to failure of identification of the RIGHT ovary. Low resistance arterial and venous waveforms are seen within the soft tissue between the loculated collections in the RIGHT adnexa on duplex sonography. Electronically Signed   By: Ulyses Southward M.D.   On: 04/22/2017 19:44   Ct Abdomen Pelvis W Contrast  Result Date: 04/22/2017 CLINICAL DATA:  Pelvic pain and constipation for several days. EXAM: CT ABDOMEN AND PELVIS WITH CONTRAST TECHNIQUE: Multidetector CT imaging of the abdomen and pelvis was performed using the standard protocol following bolus administration of intravenous contrast. CONTRAST:  ISOVUE-300 IOPAMIDOL (ISOVUE-300) INJECTION 61% COMPARISON:  None. FINDINGS: Lower chest: No acute abnormality. Hepatobiliary: There are 2 small low-density lesions within the liver, largest measures 1 cm in the left lobe liver consistent with liver cysts. The liver is otherwise normal. The gallbladder is normal. The biliary tree is normal. Pancreas: Unremarkable. No pancreatic ductal  dilatation or surrounding inflammatory changes. Spleen: Normal in size without focal abnormality. Adrenals/Urinary Tract: The bilateral adrenal glands are normal. There is a tiny cysts within the lower pole left kidney. The kidneys are otherwise normal. There is no hydronephrosis bilaterally. The bladder is partially decompressed without gross abnormality. Stomach/Bowel: Stomach is within normal limits. Appendix appears normal. No evidence of bowel wall thickening, distention, or inflammatory changes. Vascular/Lymphatic: No significant vascular findings are present. No enlarged abdominal or pelvic lymph nodes. Reproductive: The right ovary is enlarged measuring 5 x 10.4 cm with multiple cysts identified in the ovary, the largest cyst measures 4.5 x 5 cm. There are small cysts within the left ovary. The uterus is normal. Small amount of free fluid is identified in the pelvis. Other: None. Musculoskeletal: No acute or significant osseous findings. IMPRESSION: Enlarged right ovary measuring 5 x 10.4 cm with multiple cysts, largest cyst measures 4.5 x 5 cm. Consider further evaluation with pelvic ultrasound to exclude ovarian torsion. Electronically Signed   By: Sherian Rein M.D.   On: 04/22/2017 16:52   [place] Art/ven Flow Abd Pelv Doppler  Result Date: 04/22/2017 CLINICAL DATA:  Pelvic pain,  abnormal CT scan demonstrating abnormal fluid collections in the RIGHT adnexa question cystic ovarian mass, multiple cysts or tubo-ovarian abscess EXAM: TRANSABDOMINAL AND TRANSVAGINAL ULTRASOUND OF PELVIS DOPPLER ULTRASOUND OF OVARIES TECHNIQUE: Both transabdominal and transvaginal ultrasound examinations of the pelvis were performed. Transabdominal technique was performed for global imaging of the pelvis including uterus, ovaries, adnexal regions, and pelvic cul-de-sac. It was necessary to proceed with endovaginal exam following the transabdominal exam to visualize the RIGHT ovary and adnexa. Color and duplex Doppler ultrasound  was utilized to evaluate blood flow to the ovaries. Transvaginal imaging was terminated by the patient prior to completion of RIGHT adnexal assessment secondary to pain. COMPARISON:  CT abdomen and pelvis 04/22/2017 FINDINGS: Uterus Measurements: 10.5 x 5.0 x 5.4 cm.  Normal morphology without mass. Endometrium Thickness: 2 mm thick.  No endometrial fluid or focal abnormality Right ovary No definite normal appearing RIGHT ovarian tissue is identified, see below. Left ovary Measurements: 2.5 x 4.9 x 3.1 cm.  Normal morphology without mass Pulsed Doppler evaluation of both ovaries demonstrates venous and low resistance arterial waveforms within the LEFT ovary. Normal appearing RIGHT ovary not visualized. Other findings No abnormal free fluid. In the LEFT adnexa, a complex cystic collection is identified, somewhat elongated, 6.8 x 5.7 x 4.5 cm, question complex cystic mass versus tubo-ovarian abscess. RIGHT adnexal imaging was not performed on transvaginal imaging due to patient termination of procedure but on transabdominal imaging multiple rounded cystic collections are identified, on certain if representing multiple ovarian cysts or tubo-ovarian abscess. No normal appearing RIGHT ovary visualized. In aggregate the above complex RIGHT adnexal collection measures 6.2 x 10.5 x 4.9 cm and demonstrates presence of internal blood flow on color Doppler imaging within the tissue/septations between the loculations, with presence of venous and low resistance arterial waveforms on duplex sonography. IMPRESSION: Unremarkable uterus and LEFT ovary. Complex fluid collection in LEFT adnexa, somewhat elongated, question tubo-ovarian abscess. Incomplete assessment of the RIGHT adnexa on transvaginal imaging due to patient termination of exam. Nonvisualization of a normal appearing RIGHT ovary. Complex multiloculated collection in the RIGHT adnexa 6.2 x 10.5 x 4.9 cm question multiple cystic lesions within RIGHT ovary versus single  multiloculated cystic lesion of the RIGHT ovary versus tubo-ovarian abscess. Unable to definitively assess for RIGHT ovarian torsion due to failure of identification of the RIGHT ovary. Low resistance arterial and venous waveforms are seen within the soft tissue between the loculated collections in the RIGHT adnexa on duplex sonography. Electronically Signed   By: Ulyses Southward M.D.   On: 04/22/2017 19:44    Procedures Procedures (including critical care time)  Medications Ordered in ED Medications  oxyCODONE-acetaminophen (PERCOCET/ROXICET) 5-325 MG per tablet 1 tablet (1 tablet Oral Given 04/22/17 0508)  oxyCODONE-acetaminophen (PERCOCET/ROXICET) 5-325 MG per tablet 1 tablet (1 tablet Oral Given 04/22/17 1541)  iopamidol (ISOVUE-300) 61 % injection (100 mLs  Contrast Given 04/22/17 1628)  oxyCODONE-acetaminophen (PERCOCET/ROXICET) 5-325 MG per tablet 1 tablet (1 tablet Oral Given 04/22/17 2034)  metroNIDAZOLE (FLAGYL) tablet 500 mg (500 mg Oral Given 04/22/17 2034)  amoxicillin-clavulanate (AUGMENTIN) 875-125 MG per tablet 1 tablet (1 tablet Oral Given 04/22/17 2034)     Initial Impression / Assessment and Plan / ED Course  I have reviewed the triage vital signs and the nursing notes.  Pertinent labs & imaging results that were available during my care of the patient were reviewed by me and considered in my medical decision making (see chart for details).     32 year old female with bilateral  lower abdominal pain, dyspareunia, diarrhea, and vomiting over the last 3 days.  She states that when she attempts to have a bowel movement that "she does not feel like she is emptying".  Urinalysis with moderate hemoglobin and minimal ketonuria and moderate leukocytes.  Doubt UTI.  Leukocytosis of 15.  GC chlamydia and gonorrhea are pending.  Wet prep positive for trichomonas.  CT abdomen pelvis with enlarged ovary measuring 5 x 10.4 cm with multiple cyst, largest cyst measures 4.5 x 5 cm.  Pelvic ultrasound was  recommended to exclude ovarian torsion.  Pelvic ultrasound with complex fluid collection in the left adnexa concerning for TOA and right adnexa demonstrated a complex multiloculated collection measuring 6.2 x 10.5 x 4.9 cm that was concerning for multiple cystic lesions versus a single multiloculated cystic lesion versus TOA. Consulted OB/GYN and spoke with Dr. Erin Fulling, who I spoke with before wet prep had resulted, who recommended 500 mg of metronidazole twice daily and Augmentin 875 mg twice daily for 7 days.  Her office will call the patient tomorrow to schedule a follow-up appointment either tomorrow or when they reopen in 4 days at the latest.  After pelvic exam, I am concerned for bilateral TOA secondary to PID.  Will continue antibiotic regimen as recommended by OB/GYN.  The patient was given strict return precautions and told that if she needed to be reevaluated for worsening symptoms prior to OB/GYN appointment to go to [facility].  She acknowledged this information and all questions were answered.  We will discharge the patient with pain control. A 12-month prescription history query was performed using the [place] CSRS prior to discharge.  At discharge, she is in no acute distress.  She is hemodynamically stable.  Will discharge to home at this time.   Final Clinical Impressions(s) / ED Diagnoses   Final diagnoses:  Mass of left ovary  Mass of right ovary  Pelvic pain in female    ED Discharge Orders        Ordered    metroNIDAZOLE (FLAGYL) 500 MG tablet  2 times daily     04/22/17 2017    amoxicillin-clavulanate (AUGMENTIN) 875-125 MG tablet  Every 12 hours     04/22/17 2017    oxyCODONE-acetaminophen (PERCOCET/ROXICET) 5-325 MG tablet  Every 8 hours PRN     04/22/17 2017       Rawson Minix A, PA-C 04/22/17 2224    Pricilla Loveless, MD 04/23/17 2217

## 2017-04-22 NOTE — ED Notes (Signed)
[facility]/c the blood work ordered.

## 2017-04-22 NOTE — ED Notes (Signed)
Attempted IV stick x1 unsuccessfully  

## 2017-04-23 ENCOUNTER — Telehealth: Payer: Self-pay | Admitting: Obstetrics & Gynecology

## 2017-04-23 LAB — GC/CHLAMYDIA PROBE AMP (~~LOC~~) NOT AT ARMC
Chlamydia: NEGATIVE
Neisseria Gonorrhea: POSITIVE — AB

## 2017-04-23 NOTE — Telephone Encounter (Signed)
Called patient to inform her about her appointment on Monday 02/11, and I was not able to leave a voicemail because it had not been setup yet.

## 2017-04-26 ENCOUNTER — Encounter: Payer: Self-pay | Admitting: Obstetrics and Gynecology

## 2017-04-26 ENCOUNTER — Ambulatory Visit (INDEPENDENT_AMBULATORY_CARE_PROVIDER_SITE_OTHER): Payer: Self-pay | Admitting: Obstetrics and Gynecology

## 2017-04-26 VITALS — BP 126/77 | HR 106 | Ht 63.0 in | Wt 145.5 lb

## 2017-04-26 DIAGNOSIS — N730 Acute parametritis and pelvic cellulitis: Secondary | ICD-10-CM

## 2017-04-26 MED ORDER — AZITHROMYCIN 250 MG PO TABS
1000.0000 mg | ORAL_TABLET | Freq: Once | ORAL | Status: AC
  Administered 2017-04-26: 1000 mg via ORAL

## 2017-04-26 MED ORDER — METRONIDAZOLE 250 MG PO TABS
2000.0000 mg | ORAL_TABLET | Freq: Once | ORAL | Status: AC
  Administered 2017-04-26: 2000 mg via ORAL

## 2017-04-26 MED ORDER — CEFTRIAXONE SODIUM 500 MG IJ SOLR
250.0000 mg | Freq: Once | INTRAMUSCULAR | Status: AC
Start: 2017-04-26 — End: 2017-04-26
  Administered 2017-04-26: 250 mg via INTRAMUSCULAR

## 2017-04-26 NOTE — Progress Notes (Signed)
Obstetrics and Gynecology New Patient Evaluation  Appointment Date: 04/26/2017  OBGYN Clinic: [facility]  Primary Care Provider: Patient, No Pcp Per  Referring Provider: [facility]  Chief Complaint:  Chief Complaint  Patient presents with  . Follow-up    History of Present Illness: Denise Perry is a 32 y.o. African-American [id number] (Patient's last menstrual period was 04/13/2017.), seen for follow up ED visit for lower abdominal pain.  She presented to the ED on 04/22/17 for 3 days of lower abdominal pain, most prominent on the right side.  She describes the pain as a pressure that increases in intensity with bowel movements, urination, or passing gas.  Imaging showed ovarian enlargement concerning for possible ovarian cysts or TOA.  She was prescribed Flagyl and Augmentin for possible PID/TOA infection and oxycodone for pain; she was + for trich in the ED and her swab came back + for gonorrhea later.   Since ED discharge her pain has worsened.  She filled the oxycodone prescription but was unable to fill the abx due to price.  Her past medical history is significant for chlamydial infection many years ago.  She is not currently sexually active and denies having intercourse for over 4 months. She endorses feeling febrile (has not checked her temperature), chills, loss of appetite, headaches.   No breast s/s, chest pain, SOB, nausea, vomiting, dysuria, hematuria, vaginal itching, SUI or OAB, diarrhea, constipation, blood in BMs  Review of Systems:  Her 12 point review of systems is negative or as noted in the History of Present Illness.  The following portions of the patient's history were reviewed and updated as appropriate: current medications, past medical history, past social history and problem list.  Past Medical History:  Past Medical History:  Diagnosis Date  . Anxiety   . Hypertension   . Migraines     Past Surgical History:  No past surgical  history on file.  Past Obstetrical History:  OB History  Gravida Para Term Preterm AB Living  2 2 2     2   SAB TAB Ectopic Multiple Live Births          2    # Outcome Date GA Lbr Len/2nd Weight Sex Delivery Anes PTL Lv  2 Term           1 Term             Obstetric Comments  SVD x 2   Past Gynecological History: As per HPI. Periods: irregular  Social History:  Social History   Socioeconomic History  . Marital status: Single    Spouse name: Not on file  . Number of children: Not on file  . Years of education: Not on file  . Highest education level: Not on file  Social Needs  . Financial resource strain: Not on file  . Food insecurity - worry: Not on file  . Food insecurity - inability: Not on file  . Transportation needs - medical: Not on file  . Transportation needs - non-medical: Not on file  Occupational History  . Not on file  Tobacco Use  . Smoking status: Current Every Day Smoker    Packs/day: 1.00  . Smokeless tobacco: Never Used  Substance and Sexual Activity  . Alcohol use: Yes    Comment: socially   . Drug use: No  . Sexual activity: Not on file  Other Topics Concern  . Not on file  Social History Narrative  . Not on  file    Family History: No family history on file.  Health Maintenance:  Mammogram(s): No.  Colonoscopy: No.    Medications: Current Outpatient Medications  Medication Sig Dispense Refill  . oxyCODONE-acetaminophen (PERCOCET/ROXICET) 5-325 MG tablet Take 1 tablet by mouth every 8 (eight) hours as needed for severe pain. 15 tablet 0    Allergies Patient has no known allergies.   Physical Exam:  BP 126/77   Pulse (!) 106   Ht 5\' 3"  (1.6 m)   Wt 145 lb 8 oz (66 kg)   LMP 04/13/2017   BMI 25.77 kg/m  Body mass index is 25.77 kg/m. General appearance: Well nourished, well developed female in no acute distress.  Cardiovascular: Regular rate and rhythm.  Normal s1 and s2.  No murmurs, rubs or gallops  appreciated. Respiratory:  Clear to auscultation bilateral. Normal respiratory effort. Abdomen: Normo-active bowel sounds. Patient notes some discomfort to palpation in RLQ and LLQ. No palpable masses or hernias. No peritoneal s/s Neuro/Psych:  Normal mood and affect.  Skin:  Warm and dry.  Lymphatic:  No inguinal lymphadenopathy.   Laboratory: As per HPI  Radiology:  CLINICAL DATA:  Pelvic pain, abnormal CT scan demonstrating abnormal fluid collections in the RIGHT adnexa question cystic ovarian mass, multiple cysts or tubo-ovarian abscess  EXAM: TRANSABDOMINAL AND TRANSVAGINAL ULTRASOUND OF PELVIS  DOPPLER ULTRASOUND OF OVARIES  TECHNIQUE: Both transabdominal and transvaginal ultrasound examinations of the pelvis were performed. Transabdominal technique was performed for global imaging of the pelvis including uterus, ovaries, adnexal regions, and pelvic cul-de-sac.  It was necessary to proceed with endovaginal exam following the transabdominal exam to visualize the RIGHT ovary and adnexa. Color and duplex Doppler ultrasound was utilized to evaluate blood flow to the ovaries. Transvaginal imaging was terminated by the patient prior to completion of RIGHT adnexal assessment secondary to pain.  COMPARISON:  CT abdomen and pelvis 04/22/2017  FINDINGS: Uterus  Measurements: 10.5 x 5.0 x 5.4 cm.  Normal morphology without mass.  Endometrium  Thickness: 2 mm thick.  No endometrial fluid or focal abnormality  Right ovary  No definite normal appearing RIGHT ovarian tissue is identified, see below.  Left ovary  Measurements: 2.5 x 4.9 x 3.1 cm.  Normal morphology without mass  Pulsed Doppler evaluation of both ovaries demonstrates venous and low resistance arterial waveforms within the LEFT ovary. Normal appearing RIGHT ovary not visualized.  Other findings  No abnormal free fluid.  In the LEFT adnexa, a complex cystic collection is  identified, somewhat elongated, 6.8 x 5.7 x 4.5 cm, question complex cystic mass versus tubo-ovarian abscess.  RIGHT adnexal imaging was not performed on transvaginal imaging due to patient termination of procedure but on transabdominal imaging multiple rounded cystic collections are identified, on certain if representing multiple ovarian cysts or tubo-ovarian abscess. No normal appearing RIGHT ovary visualized. In aggregate the above complex RIGHT adnexal collection measures 6.2 x 10.5 x 4.9 cm and demonstrates presence of internal blood flow on color Doppler imaging within the tissue/septations between the loculations, with presence of venous and low resistance arterial waveforms on duplex sonography.  IMPRESSION: Unremarkable uterus and LEFT ovary.  Complex fluid collection in LEFT adnexa, somewhat elongated, question tubo-ovarian abscess.  Incomplete assessment of the RIGHT adnexa on transvaginal imaging due to patient termination of exam.  Nonvisualization of a normal appearing RIGHT ovary.  Complex multiloculated collection in the RIGHT adnexa 6.2 x 10.5 x 4.9 cm question multiple cystic lesions within RIGHT ovary versus single multiloculated cystic lesion  of the RIGHT ovary versus tubo-ovarian abscess.  Unable to definitively assess for RIGHT ovarian torsion due to failure of identification of the RIGHT ovary.  Low resistance arterial and venous waveforms are seen within the soft tissue between the loculated collections in the RIGHT adnexa on duplex sonography.   Electronically Signed   By: Mark  Boles M.D.   On: 04/22/2017 19:44  CLINICAL DATA:  Pelvic pain and constipation foUlyses [place] several days.  EXAM: CT ABDOMEN AND PELVIS WITH CONTRAST  TECHNIQUE: Multidetector CT imaging of the abdomen and pelvis was performed using the standard protocol following bolus administration of intravenous contrast.  CONTRAST:  100mL ISOVUE-300 IOPAMIDOL (ISOVUE-300)  INJECTION 61%  COMPARISON:  None.  FINDINGS: Lower chest: No acute abnormality.  Hepatobiliary: There are 2 small low-density lesions within the liver, largest measures 1 cm in the left lobe liver consistent with liver cysts. The liver is otherwise normal. The gallbladder is normal. The biliary tree is normal.  Pancreas: Unremarkable. No pancreatic ductal dilatation or surrounding inflammatory changes.  Spleen: Normal in size without focal abnormality.  Adrenals/Urinary Tract: The bilateral adrenal glands are normal. There is a tiny cysts within the lower pole left kidney. The kidneys are otherwise normal. There is no hydronephrosis bilaterally. The bladder is partially decompressed without gross abnormality.  Stomach/Bowel: Stomach is within normal limits. Appendix appears normal. No evidence of bowel wall thickening, distention, or inflammatory changes.  Vascular/Lymphatic: No significant vascular findings are present. No enlarged abdominal or pelvic lymph nodes.  Reproductive: The right ovary is enlarged measuring 5 x 10.4 cm with multiple cysts identified in the ovary, the largest cyst measures 4.5 x 5 cm. There are small cysts within the left ovary. The uterus is normal. Small amount of free fluid is identified in the pelvis.  Other: None.  Musculoskeletal: No acute or significant osseous findings.  IMPRESSION: Enlarged right ovary measuring 5 x 10.4 cm with multiple cysts, largest cyst measures 4.5 x 5 cm. Consider further evaluation with pelvic ultrasound to exclude ovarian torsion.   Electronically Signed   By: Sherian ReinWei-Chen  Lin M.D.   On: 04/22/2017 16:52  Assessment: Denise Perry is a 32 yo woman who presents with symptoms and workup consistent with PID from gonorrhea and trichomonal infections.  She was seen in the ED 4 days ago and was prescribed antibiotics for presumed PID; however, she was unable to fill her prescriptions.  She requires adequate  antibiotic microbial coverage for infection, which will likely help resolve her current symptoms over the next days-weeks.    Plan:  1. PID (acute pelvic inflammatory disease) - treat in clinic with metronidazole PO 2000 mg once, ceftriaxone IM 250mg  once, azithromycin PO 1000mg  once - f/u in clinic later this week to assess progression - recommend getting repeat imaging in 6-8 weeks  - blood test today for HIV, syphilis, Hep B, Hep C  Return precautions given to patient including worsening pain and fever.  Cornelia Copaharlie Acacia Latorre, Jr MD Attending [facility](Faculty Practice)

## 2017-04-27 DIAGNOSIS — N730 Acute parametritis and pelvic cellulitis: Secondary | ICD-10-CM | POA: Insufficient documentation

## 2017-04-29 ENCOUNTER — Encounter: Payer: Self-pay | Admitting: Obstetrics and Gynecology

## 2017-04-29 ENCOUNTER — Encounter: Payer: Self-pay | Admitting: Obstetrics & Gynecology

## 2017-04-29 ENCOUNTER — Ambulatory Visit (INDEPENDENT_AMBULATORY_CARE_PROVIDER_SITE_OTHER): Payer: Self-pay | Admitting: Obstetrics & Gynecology

## 2017-04-29 VITALS — BP 130/78 | HR 84 | Wt 145.5 lb

## 2017-04-29 DIAGNOSIS — R768 Other specified abnormal immunological findings in serum: Secondary | ICD-10-CM | POA: Insufficient documentation

## 2017-04-29 DIAGNOSIS — N730 Acute parametritis and pelvic cellulitis: Secondary | ICD-10-CM

## 2017-04-29 DIAGNOSIS — A539 Syphilis, unspecified: Secondary | ICD-10-CM

## 2017-04-29 LAB — HEPATITIS B SURFACE ANTIGEN: Hepatitis B Surface Ag: NEGATIVE

## 2017-04-29 LAB — HIV ANTIBODY (ROUTINE TESTING W REFLEX): HIV SCREEN 4TH GENERATION: NONREACTIVE

## 2017-04-29 LAB — RPR, QUANT+TP ABS (REFLEX)
Rapid Plasma Reagin, Quant: 1:1 {titer} — ABNORMAL HIGH
TREPONEMA PALLIDUM AB: NEGATIVE

## 2017-04-29 LAB — HEPATITIS C ANTIBODY

## 2017-04-29 LAB — RPR: RPR Ser Ql: REACTIVE — AB

## 2017-04-29 MED ORDER — PENICILLIN G BENZATHINE 1200000 UNIT/2ML IM SUSP
2.4000 10*6.[IU] | INTRAMUSCULAR | Status: AC
  Administered 2017-04-29 – 2017-05-14 (×2): 2.4 10*6.[IU] via INTRAMUSCULAR

## 2017-04-29 NOTE — Addendum Note (Signed)
Addended by: Garret ReddishBARNES, Aura Bibby M on: 04/29/2017 12:49 PM   Modules accepted: Orders

## 2017-04-29 NOTE — Progress Notes (Addendum)
Patient ID: Denise Perry, female   DOB: [birth year], 32 y.o.   MRN: [id number]  No chief complaint on file.   HPI Denise Nyhanashona Perry Cressler is a 32 y.o. female.   HPI 32 yo single P2 (sons- 4814 and 34 yo) here today for a 3 day followup after outpatient treatment started for PID. She received rocephin and 1000mg  of zithromax in clinic 3 days ago. She feels " a little better". She still has a little RLQ" pain. She has had some diarrhea twice in the last 3 days ago. She denies fevers.  She has not had sex for about 3 months.  Past Medical History:  Diagnosis Date  . Anxiety   . Hypertension   . Migraines     No past surgical history on file.  No family history on file.  Social History Social History   Tobacco Use  . Smoking status: Current Every Day Smoker    Packs/day: 1.00  . Smokeless tobacco: Never Used  Substance Use Topics  . Alcohol use: Yes    Comment: socially   . Drug use: No    No Known Allergies  Current Outpatient Medications  Medication Sig Dispense Refill  . ALPRAZolam (XANAX) 0.5 MG tablet Take 1 tablet every 6 hours as needed for chest pain/anxiety. (Patient not taking: Reported on 11/28/2016) 6 tablet 0  . amoxicillin (AMOXIL) 500 MG capsule Take 2 capsules (1,000 mg total) by mouth 2 (two) times daily. (Patient not taking: Reported on 11/18/2016) 40 capsule 0  . amoxicillin-clavulanate (AUGMENTIN) 875-125 MG tablet Take 1 tablet by mouth every 12 (twelve) hours. (Patient not taking: Reported on 04/29/2017) 14 tablet 0  . hydrOXYzine (ATARAX/VISTARIL) 25 MG tablet Take 1 tablet (25 mg total) by mouth every 8 (eight) hours as needed for anxiety. (Patient not taking: Reported on 04/26/2017) 12 tablet 0  . LORazepam (ATIVAN) 1 MG tablet Take 1 tablet (1 mg total) by mouth every 8 (eight) hours as needed for anxiety. (Patient not taking: Reported on 11/18/2016) 3 tablet 0  . metroNIDAZOLE (FLAGYL) 500 MG tablet Take 1 tablet (500 mg total) by mouth 2 (two) times daily. (Patient  not taking: Reported on 04/29/2017) 14 tablet 0  . oxyCODONE-acetaminophen (PERCOCET/ROXICET) 5-325 MG tablet Take 1 tablet by mouth every 8 (eight) hours as needed for severe pain. (Patient not taking: Reported on 04/29/2017) 15 tablet 0   No current facility-administered medications for this visit.     Review of Systems Review of Systems Last pap several years ago health dept  Blood pressure 130/78, pulse 84, weight 145 lb 8 oz (66 kg), last menstrual period 04/13/2017.  Physical Exam Physical Exam Breathing, conversing, and ambulating normally Well nourished, well hydrated Black female, no apparent distress  Data Reviewed Results for Denise Perry, Denise Perry (MRN [id number]) as of 04/29/2017 11:42  Ref. Range 04/26/2017 10:18  Rapid Plasma Reagin, Quant Latest Ref Range: NonRea<1:1  1:1 (H)  T Pallidum Abs Latest Ref Range: Negative  Negative  RPR Latest Ref Range: Non Reactive  Reactive (A)  Hepatitis B Surface Ag Latest Ref Range: Negative  Negative  Hep C Virus Ab Latest Ref Range: 0.0 - 0.9 s/co ratio <0.1  HIV Screen 4th Generation wRfx Latest Ref Range: Non Reactive  Non Reactive    Assessment    PID- treated on an outpatient basis- doing fine + RPR    Plan    Repeat gyn u/s in 3 months 2.4 million units of Pen G today and repeat for  a total of 3 weeks Phone # given for free pap clinic Rec safe sex habits       Allie Bossier 04/29/2017, 11:37 AM

## 2017-05-06 ENCOUNTER — Ambulatory Visit: Payer: Self-pay

## 2017-05-07 ENCOUNTER — Ambulatory Visit (INDEPENDENT_AMBULATORY_CARE_PROVIDER_SITE_OTHER): Payer: Self-pay | Admitting: *Deleted

## 2017-05-07 DIAGNOSIS — A530 Latent syphilis, unspecified as early or late: Secondary | ICD-10-CM

## 2017-05-07 MED ORDER — PENICILLIN G BENZATHINE 1200000 UNIT/2ML IM SUSP
2.4000 10*6.[IU] | Freq: Once | INTRAMUSCULAR | Status: AC
  Administered 2017-05-07: 2.4 10*6.[IU] via INTRAMUSCULAR

## 2017-05-07 NOTE — Progress Notes (Signed)
Pt in for Pen G 2.4 million units. Injections given with patient lying flat on bed. Patient tolerated well. She left feeling well and had no problems or concerns.

## 2017-05-13 ENCOUNTER — Ambulatory Visit: Payer: Self-pay

## 2017-05-14 ENCOUNTER — Ambulatory Visit (INDEPENDENT_AMBULATORY_CARE_PROVIDER_SITE_OTHER): Payer: Self-pay | Admitting: General Practice

## 2017-05-14 VITALS — BP 139/80 | HR 91 | Ht 63.0 in | Wt 148.0 lb

## 2017-05-14 DIAGNOSIS — A539 Syphilis, unspecified: Secondary | ICD-10-CM

## 2017-05-14 DIAGNOSIS — A530 Latent syphilis, unspecified as early or late: Secondary | ICD-10-CM

## 2017-05-17 NOTE — Progress Notes (Signed)
I have reviewed this chart and agree with the RN/CMA assessment and management.    Mac Dowdell C Jaycee Mckellips, MD, FACOG Attending Physician, Faculty Practice [facility]  

## 2017-05-22 ENCOUNTER — Emergency Department (HOSPITAL_COMMUNITY)
Admission: EM | Admit: 2017-05-22 | Discharge: 2017-05-22 | Disposition: A | Discharge status: Home / Self Care | Payer: Self-pay | Attending: Emergency Medicine | Admitting: Emergency Medicine

## 2017-05-22 ENCOUNTER — Encounter (HOSPITAL_COMMUNITY): Payer: Self-pay | Admitting: Emergency Medicine

## 2017-05-22 DIAGNOSIS — I10 Essential (primary) hypertension: Secondary | ICD-10-CM | POA: Insufficient documentation

## 2017-05-22 DIAGNOSIS — R112 Nausea with vomiting, unspecified: Secondary | ICD-10-CM | POA: Insufficient documentation

## 2017-05-22 DIAGNOSIS — F17200 Nicotine dependence, unspecified, uncomplicated: Secondary | ICD-10-CM | POA: Insufficient documentation

## 2017-05-22 LAB — CBC
HCT: 41.6 % (ref 36.0–46.0)
Hemoglobin: 14 g/dL (ref 12.0–15.0)
MCH: 31.7 pg (ref 26.0–34.0)
MCHC: 33.7 g/dL (ref 30.0–36.0)
MCV: 94.3 fL (ref 78.0–100.0)
PLATELETS: 255 10*3/uL (ref 150–400)
RBC: 4.41 MIL/uL (ref 3.87–5.11)
RDW: 15.7 % — AB (ref 11.5–15.5)
WBC: 9.1 10*3/uL (ref 4.0–10.5)

## 2017-05-22 LAB — COMPREHENSIVE METABOLIC PANEL
ALK PHOS: 101 U/L (ref 38–126)
ALT: 18 U/L (ref 14–54)
AST: 21 U/L (ref 15–41)
Albumin: 4.3 g/dL (ref 3.5–5.0)
Anion gap: 11 (ref 5–15)
BILIRUBIN TOTAL: 1 mg/dL (ref 0.3–1.2)
BUN: 7 mg/dL (ref 6–20)
CALCIUM: 9.2 mg/dL (ref 8.9–10.3)
CO2: 24 mmol/L (ref 22–32)
CREATININE: 0.71 mg/dL (ref 0.44–1.00)
Chloride: 107 mmol/L (ref 101–111)
Glucose, Bld: 115 mg/dL — ABNORMAL HIGH (ref 65–99)
Potassium: 4.1 mmol/L (ref 3.5–5.1)
Sodium: 142 mmol/L (ref 135–145)
TOTAL PROTEIN: 7.7 g/dL (ref 6.5–8.1)

## 2017-05-22 LAB — LIPASE, BLOOD: LIPASE: 20 U/L (ref 11–51)

## 2017-05-22 LAB — I-STAT BETA HCG BLOOD, ED (MC, WL, AP ONLY)

## 2017-05-22 MED ORDER — SODIUM CHLORIDE 0.9 % IV BOLUS (SEPSIS)
1000.0000 mL | Freq: Once | INTRAVENOUS | Status: AC
  Administered 2017-05-22: 1000 mL via INTRAVENOUS

## 2017-05-22 MED ORDER — ONDANSETRON HCL 4 MG/2ML IJ SOLN
4.0000 mg | Freq: Once | INTRAMUSCULAR | Status: AC | PRN
  Administered 2017-05-22: 4 mg via INTRAVENOUS
  Filled 2017-05-22: qty 2

## 2017-05-22 MED ORDER — ONDANSETRON HCL 4 MG PO TABS: 4.0000 mg | ORAL_TABLET | Freq: Three times a day (TID) | ORAL | 0 refills | Status: DC | PRN

## 2017-05-22 NOTE — Discharge Instructions (Signed)
Your evaluated in the emergency department for nausea and vomiting drinking alcohol last night.  You should continue clear liquids until your stomach settles down and then advance diet as tolerated.  we are prescribing you Zofran to help with the nausea if needed.

## 2017-05-22 NOTE — ED Triage Notes (Signed)
Per [facility] patient from home for /v that started around 230 am after drinking all night.

## 2017-05-22 NOTE — ED Provider Notes (Signed)
[facility]-EMERGENCY DEPT Provider Note   CSN: [id number] Arrival date & time: 05/22/17  0957     History   Chief Complaint Chief Complaint  Patient presents with  . Emesis    HPI Denise Perry is a 32 y.o. female.  She presents to the emergency department with intractable vomiting since 2 AM.  There is been no blood in the vomitus.  She states is mostly foam.  She attributes this to drinking too much last night.  She states she had for a [place] ice teas at a birthday party and this is too much for her.  She denies any abdominal pain or diarrhea or fever.  She denies any drugs.  She has no prior significant abdominal history.  Last menstrual period was 1 month ago.  The history is provided by the patient.  Emesis   This is a new problem. The current episode started 6 to 12 hours ago. The problem occurs more than 10 times per day. The problem has not changed since onset.The emesis has an appearance of stomach contents. There has been no fever. The fever has been present for less than 1 day. Pertinent negatives include no abdominal pain, no arthralgias, no chills, no cough, no diarrhea, no fever, no headaches, no sweats and no URI.    Past Medical History:  Diagnosis Date  . Anxiety   . Hypertension   . Migraines     Patient Active Problem List   Diagnosis Date Noted  . Biological false positive RPR test 04/29/2017  . PID (acute pelvic inflammatory disease) 04/27/2017    History reviewed. No pertinent surgical history.  OB History    Gravida Para Term Preterm AB Living   2 2 2     2    SAB TAB Ectopic Multiple Live Births           2      Obstetric Comments   SVD x 2       Home Medications    Prior to Admission medications   Medication Sig Start Date End Date Taking? Authorizing Provider  ALPRAZolam Prudy Feeler) 0.5 MG tablet Take 1 tablet every 6 hours as needed for chest pain/anxiety. Patient not taking: Reported on 11/28/2016 11/27/16    Geoffery Lyons, MD  amoxicillin (AMOXIL) 500 MG capsule Take 2 capsules (1,000 mg total) by mouth 2 (two) times daily. Patient not taking: Reported on 11/18/2016 10/22/13   Rodolph Bong, MD  amoxicillin-clavulanate (AUGMENTIN) 875-125 MG tablet Take 1 tablet by mouth every 12 (twelve) hours. Patient not taking: Reported on 04/29/2017 04/22/17   McDonald, Pedro Earls A, PA-C  hydrOXYzine (ATARAX/VISTARIL) 25 MG tablet Take 1 tablet (25 mg total) by mouth every 8 (eight) hours as needed for anxiety. Patient not taking: Reported on 04/26/2017 11/28/16   Rolan Bucco, MD  LORazepam (ATIVAN) 1 MG tablet Take 1 tablet (1 mg total) by mouth every 8 (eight) hours as needed for anxiety. Patient not taking: Reported on 11/18/2016 05/11/16   Horton, Mayer Masker, MD  metroNIDAZOLE (FLAGYL) 500 MG tablet Take 1 tablet (500 mg total) by mouth 2 (two) times daily. Patient not taking: Reported on 04/29/2017 04/22/17   McDonald, Pedro Earls A, PA-C  oxyCODONE-acetaminophen (PERCOCET/ROXICET) 5-325 MG tablet Take 1 tablet by mouth every 8 (eight) hours as needed for severe pain. Patient not taking: Reported on 04/29/2017 04/22/17   Barkley Boards, PA-C    Family History No family history on file.  Social History Social History  Tobacco Use  . Smoking status: Current Every Day Smoker    Packs/day: 1.00  . Smokeless tobacco: Never Used  Substance Use Topics  . Alcohol use: Yes    Comment: socially   . Drug use: No     Allergies   Patient has no known allergies.   Review of Systems Review of Systems  Constitutional: Negative for chills and fever.  HENT: Negative for ear pain and sore throat.   Eyes: Negative for pain and visual disturbance.  Respiratory: Negative for cough and shortness of breath.   Cardiovascular: Negative for chest pain and palpitations.  Gastrointestinal: Positive for vomiting. Negative for abdominal pain and diarrhea.  Genitourinary: Negative for dysuria and hematuria.  Musculoskeletal: Negative for  arthralgias and back pain.  Skin: Negative for color change and rash.  Neurological: Negative for seizures, syncope and headaches.  All other systems reviewed and are negative.    Physical Exam Updated Vital Signs BP 119/85   Pulse 89   Temp 98.1 F (36.7 C) (Oral)   Resp 16   LMP 05/09/2017   SpO2 99%   Physical Exam  Constitutional: She appears well-developed and well-nourished.  HENT:  Head: Normocephalic and atraumatic.  Eyes: Conjunctivae are normal.  Neck: Neck supple.  Cardiovascular: Normal rate and regular rhythm.  Pulmonary/Chest: Effort normal and breath sounds normal.  Abdominal: Soft. Bowel sounds are normal. She exhibits no mass. There is no tenderness. There is no guarding.  Musculoskeletal: She exhibits no edema or tenderness.  Neurological: She is alert. GCS eye subscore is 4. GCS verbal subscore is 5. GCS motor subscore is 6.  Skin: Skin is warm and dry. Capillary refill takes less than 2 seconds.  Psychiatric: She has a normal mood and affect.     ED Treatments / Results  Labs (all labs ordered are listed, but only abnormal results are displayed) Labs Reviewed  COMPREHENSIVE METABOLIC PANEL - Abnormal; Notable for the following components:      Result Value   Glucose, Bld 115 (*)    All other components within normal limits  CBC - Abnormal; Notable for the following components:   RDW 15.7 (*)    All other components within normal limits  LIPASE, BLOOD  I-STAT BETA HCG BLOOD, ED ([facility], [facility], [facility] ONLY)    EKG  EKG Interpretation None       Radiology No results found.  Procedures Procedures (including critical care time)  Medications Ordered in ED Medications  ondansetron (ZOFRAN) injection 4 mg (not administered)  sodium chloride 0.9 % bolus 1,000 mL (not administered)     Initial Impression / Assessment and Plan / ED Course  I have reviewed the triage vital signs and the nursing notes.  Pertinent labs & imaging results that were  available during my care of the patient were reviewed by me and considered in my medical decision making (see chart for details).  Clinical Course as of May 23 1927  Sat May 22, 2017  1148 Tolerated p.o.  Will discharge and have follow-up PCP or return if any worsening symptoms.  [MB]    Clinical Course User Index [MB] Terrilee FilesButler, Michael C, MD     Final Clinical Impressions(s) / ED Diagnoses   Final diagnoses:  Non-intractable vomiting with nausea, unspecified vomiting type    ED Discharge Orders        Ordered    ondansetron (ZOFRAN) 4 MG tablet  Every 8 hours PRN     05/22/17 1149  Terrilee Files, MD 05/22/17 [id number]

## 2017-05-26 ENCOUNTER — Encounter: Payer: Self-pay | Admitting: *Deleted

## 2017-05-26 ENCOUNTER — Telehealth: Payer: Self-pay | Admitting: *Deleted

## 2017-05-26 ENCOUNTER — Other Ambulatory Visit: Payer: Self-pay | Admitting: *Deleted

## 2017-05-26 NOTE — Telephone Encounter (Signed)
Attempted to call patient re: scheduled u/s appt 5/15@1000 . There was no answer and voice mail not set up. Will send letter.

## 2017-07-22 DIAGNOSIS — I10 Essential (primary) hypertension: Secondary | ICD-10-CM | POA: Insufficient documentation

## 2017-07-22 DIAGNOSIS — F17210 Nicotine dependence, cigarettes, uncomplicated: Secondary | ICD-10-CM | POA: Insufficient documentation

## 2017-07-22 DIAGNOSIS — F10929 Alcohol use, unspecified with intoxication, unspecified: Secondary | ICD-10-CM | POA: Insufficient documentation

## 2017-07-22 DIAGNOSIS — R112 Nausea with vomiting, unspecified: Secondary | ICD-10-CM | POA: Insufficient documentation

## 2017-07-23 ENCOUNTER — Emergency Department (HOSPITAL_COMMUNITY)
Admission: EM | Admit: 2017-07-23 | Discharge: 2017-07-23 | Disposition: A | Discharge status: Home / Self Care | Payer: Self-pay | Attending: Emergency Medicine | Admitting: Emergency Medicine

## 2017-07-23 ENCOUNTER — Encounter (HOSPITAL_COMMUNITY): Payer: Self-pay

## 2017-07-23 DIAGNOSIS — R112 Nausea with vomiting, unspecified: Secondary | ICD-10-CM

## 2017-07-23 DIAGNOSIS — F10920 Alcohol use, unspecified with intoxication, uncomplicated: Secondary | ICD-10-CM

## 2017-07-23 LAB — URINALYSIS, ROUTINE W REFLEX MICROSCOPIC
BILIRUBIN URINE: NEGATIVE
GLUCOSE, UA: NEGATIVE mg/dL
KETONES UR: NEGATIVE mg/dL
LEUKOCYTES UA: NEGATIVE
Nitrite: NEGATIVE
PH: 7 (ref 5.0–8.0)
Protein, ur: NEGATIVE mg/dL
Specific Gravity, Urine: 1.019 (ref 1.005–1.030)

## 2017-07-23 LAB — CBC
HCT: 43.1 % (ref 36.0–46.0)
HEMOGLOBIN: 14.6 g/dL (ref 12.0–15.0)
MCH: 31.7 pg (ref 26.0–34.0)
MCHC: 33.9 g/dL (ref 30.0–36.0)
MCV: 93.7 fL (ref 78.0–100.0)
Platelets: 227 10*3/uL (ref 150–400)
RBC: 4.6 MIL/uL (ref 3.87–5.11)
RDW: 14.7 % (ref 11.5–15.5)
WBC: 9.5 10*3/uL (ref 4.0–10.5)

## 2017-07-23 LAB — COMPREHENSIVE METABOLIC PANEL
ALK PHOS: 93 U/L (ref 38–126)
ALT: 14 U/L (ref 14–54)
ANION GAP: 11 (ref 5–15)
AST: 14 U/L — ABNORMAL LOW (ref 15–41)
Albumin: 4.3 g/dL (ref 3.5–5.0)
BUN: 8 mg/dL (ref 6–20)
CALCIUM: 9.5 mg/dL (ref 8.9–10.3)
CO2: 24 mmol/L (ref 22–32)
Chloride: 104 mmol/L (ref 101–111)
Creatinine, Ser: 0.85 mg/dL (ref 0.44–1.00)
GFR calc non Af Amer: >60 mL/min (ref >60)
Glucose, Bld: 104 mg/dL — ABNORMAL HIGH (ref 65–99)
Potassium: 4.1 mmol/L (ref 3.5–5.1)
SODIUM: 139 mmol/L (ref 135–145)
TOTAL PROTEIN: 7.6 g/dL (ref 6.5–8.1)
Total Bilirubin: 0.6 mg/dL (ref 0.3–1.2)

## 2017-07-23 LAB — I-STAT BETA HCG BLOOD, ED (MC, WL, AP ONLY): I-stat hCG, quantitative: <5 m[IU]/mL (ref <5)

## 2017-07-23 LAB — LIPASE, BLOOD: Lipase: 22 U/L (ref 11–51)

## 2017-07-23 MED ORDER — ONDANSETRON HCL 4 MG PO TABS: 4.0000 mg | ORAL_TABLET | Freq: Four times a day (QID) | ORAL | 0 refills | Status: DC | PRN

## 2017-07-23 MED ORDER — ONDANSETRON HCL 4 MG/2ML IJ SOLN
4.0000 mg | Freq: Once | INTRAMUSCULAR | Status: AC
  Administered 2017-07-23: 4 mg via INTRAVENOUS
  Filled 2017-07-23: qty 2

## 2017-07-23 MED ORDER — SODIUM CHLORIDE 0.9 % IV BOLUS
1000.0000 mL | Freq: Once | INTRAVENOUS | Status: AC
  Administered 2017-07-23: 1000 mL via INTRAVENOUS

## 2017-07-23 NOTE — ED Triage Notes (Signed)
Pt states that she drank to much tequila tonight at a friends party and has vomited several times.

## 2017-07-23 NOTE — ED Notes (Signed)
Labs reviewed.

## 2017-07-23 NOTE — ED Notes (Signed)
Pt. In room, vital signs stable, family @ bedside

## 2017-07-23 NOTE — ED Provider Notes (Signed)
[facility] EMERGENCY DEPARTMENT Provider Note   CSN: [id number] Arrival date & time: 07/22/17  2305     History   Chief Complaint Chief Complaint  Patient presents with  . Emesis    HPI Denise Perry is a 32 y.o. female.  The history is provided by the patient.  She has a history of hypertension and migraines and comes into the emergency department because of nausea and vomiting.  She had been at a party and states she consumed too much tequila.  She vomited about 6 times.  There is no blood or mucus in the emesis.  She denies abdominal pain.  She still has nausea, but has not vomited in the last several hours.  Past Medical History:  Diagnosis Date  . Anxiety   . Hypertension   . Migraines     Patient Active Problem List   Diagnosis Date Noted  . Biological false positive RPR test 04/29/2017  . PID (acute pelvic inflammatory disease) 04/27/2017    History reviewed. No pertinent surgical history.   OB History    Gravida  2   Para  2   Term  2   Preterm      AB      Living  2     SAB      TAB      Ectopic      Multiple      Live Births  2        Obstetric Comments  SVD x 2         Home Medications    Prior to Admission medications   Medication Sig Start Date End Date Taking? Authorizing Provider  amoxicillin-clavulanate (AUGMENTIN) 875-125 MG tablet Take 1 tablet by mouth every 12 (twelve) hours. Patient not taking: Reported on 04/29/2017 04/22/17   McDonald, Pedro Earls A, PA-C  hydrOXYzine (ATARAX/VISTARIL) 25 MG tablet Take 1 tablet (25 mg total) by mouth every 8 (eight) hours as needed for anxiety. Patient not taking: Reported on 04/26/2017 11/28/16   Rolan Bucco, MD  metroNIDAZOLE (FLAGYL) 500 MG tablet Take 1 tablet (500 mg total) by mouth 2 (two) times daily. Patient not taking: Reported on 04/29/2017 04/22/17   McDonald, Mia A, PA-C  ondansetron (ZOFRAN) 4 MG tablet Take 1 tablet (4 mg total) by mouth every 8 (eight) hours  as needed for nausea or vomiting. 05/22/17   Terrilee Files, MD  oxyCODONE-acetaminophen (PERCOCET/ROXICET) 5-325 MG tablet Take 1 tablet by mouth every 8 (eight) hours as needed for severe pain. Patient not taking: Reported on 04/29/2017 04/22/17   Barkley Boards, PA-C    Family History No family history on file.  Social History Social History   Tobacco Use  . Smoking status: Current Every Day Smoker    Packs/day: 1.00  . Smokeless tobacco: Never Used  Substance Use Topics  . Alcohol use: Yes    Comment: socially   . Drug use: No     Allergies   Patient has no known allergies.   Review of Systems Review of Systems  All other systems reviewed and are negative.    Physical Exam Updated Vital Signs BP 122/79 (BP Location: Right Arm)   Pulse 72   Temp 98 F (36.7 C) (Oral)   Resp 20   LMP 06/14/2017   SpO2 97%   Physical Exam  Nursing note and vitals reviewed.  32 year old female, resting comfortably and in no acute distress. Vital signs are normal.  Oxygen saturation is 97%, which is normal. Head is normocephalic and atraumatic. PERRLA, EOMI. Oropharynx is clear. Neck is nontender and supple without adenopathy or JVD. Back is nontender and there is no CVA tenderness. Lungs are clear without rales, wheezes, or rhonchi. Chest is nontender. Heart has regular rate and rhythm without murmur. Abdomen is soft, flat, nontender without masses or hepatosplenomegaly and peristalsis is hypoactive. Extremities have no cyanosis or edema, full range of motion is present. Skin is warm and dry without rash. Neurologic: Mental status is normal, cranial nerves are intact, there are no motor or sensory deficits.  ED Treatments / Results  Labs (all labs ordered are listed, but only abnormal results are displayed) Labs Reviewed  COMPREHENSIVE METABOLIC PANEL - Abnormal; Notable for the following components:      Result Value   Glucose, Bld 104 (*)    AST 14 (*)    All other  components within normal limits  URINALYSIS, ROUTINE W REFLEX MICROSCOPIC - Abnormal; Notable for the following components:   APPearance HAZY (*)    Hgb urine dipstick SMALL (*)    Bacteria, UA RARE (*)    All other components within normal limits  LIPASE, BLOOD  CBC  I-STAT BETA HCG BLOOD, ED ([facility], [facility], [facility] ONLY)    EKG None  Radiology No results found.  Procedures Procedures (including critical care time)  Medications Ordered in ED Medications  sodium chloride 0.9 % bolus 1,000 mL (has no administration in time range)  ondansetron (ZOFRAN) injection 4 mg (has no administration in time range)     Initial Impression / Assessment and Plan / ED Course  I have reviewed the triage vital signs and the nursing notes.  Pertinent labs & imaging results that were available during my care of the patient were reviewed by me and considered in my medical decision making (see chart for details).  Nausea and vomiting following ingestion of alcohol.  Probable alcohol poisoning.  Possible viral gastritis.  No history of abdominal surgery and no abdominal tenderness to suggest small bowel obstruction.  Screening labs are reassuring.  Normal electrolytes and renal function, normal WBC.  She will be given IV fluids and IV ondansetron and reassessed.  Old records are reviewed, and she had been seen 2 months ago with virtually identical complaint.  Final Clinical Impressions(s) / ED Diagnoses   Final diagnoses:  None    ED Discharge Orders    None       Dione Booze, MD 07/23/17 [phone]

## 2017-07-28 ENCOUNTER — Ambulatory Visit (HOSPITAL_COMMUNITY): Payer: Self-pay | Attending: Obstetrics & Gynecology

## 2017-07-30 ENCOUNTER — Emergency Department (HOSPITAL_COMMUNITY)
Admission: EM | Admit: 2017-07-30 | Discharge: 2017-07-31 | Disposition: A | Discharge status: Home / Self Care | Payer: Self-pay | Attending: Emergency Medicine | Admitting: Emergency Medicine

## 2017-07-30 ENCOUNTER — Other Ambulatory Visit: Payer: Self-pay

## 2017-07-30 ENCOUNTER — Encounter (HOSPITAL_COMMUNITY): Payer: Self-pay | Admitting: Emergency Medicine

## 2017-07-30 ENCOUNTER — Emergency Department (HOSPITAL_COMMUNITY): Payer: Self-pay

## 2017-07-30 DIAGNOSIS — F17200 Nicotine dependence, unspecified, uncomplicated: Secondary | ICD-10-CM | POA: Insufficient documentation

## 2017-07-30 DIAGNOSIS — R112 Nausea with vomiting, unspecified: Secondary | ICD-10-CM | POA: Insufficient documentation

## 2017-07-30 DIAGNOSIS — F419 Anxiety disorder, unspecified: Secondary | ICD-10-CM | POA: Insufficient documentation

## 2017-07-30 DIAGNOSIS — R079 Chest pain, unspecified: Secondary | ICD-10-CM | POA: Insufficient documentation

## 2017-07-30 DIAGNOSIS — I10 Essential (primary) hypertension: Secondary | ICD-10-CM | POA: Insufficient documentation

## 2017-07-30 DIAGNOSIS — R110 Nausea: Secondary | ICD-10-CM

## 2017-07-30 LAB — URINALYSIS, ROUTINE W REFLEX MICROSCOPIC
BILIRUBIN URINE: NEGATIVE
Bacteria, UA: NONE SEEN
GLUCOSE, UA: NEGATIVE mg/dL
KETONES UR: NEGATIVE mg/dL
LEUKOCYTES UA: NEGATIVE
Nitrite: NEGATIVE
PH: 7 (ref 5.0–8.0)
Protein, ur: NEGATIVE mg/dL
Specific Gravity, Urine: 1.019 (ref 1.005–1.030)

## 2017-07-30 LAB — CBC WITH DIFFERENTIAL/PLATELET
Abs Immature Granulocytes: 0.1 10*3/uL (ref 0.0–0.1)
Basophils Absolute: 0 10*3/uL (ref 0.0–0.1)
Basophils Relative: 0 %
EOS PCT: 1 %
Eosinophils Absolute: 0 10*3/uL (ref 0.0–0.7)
HEMATOCRIT: 42.1 % (ref 36.0–46.0)
Hemoglobin: 14 g/dL (ref 12.0–15.0)
IMMATURE GRANULOCYTES: 1 %
LYMPHS ABS: 2.1 10*3/uL (ref 0.7–4.0)
Lymphocytes Relative: 24 %
MCH: 30.8 pg (ref 26.0–34.0)
MCHC: 33.3 g/dL (ref 30.0–36.0)
MCV: 92.5 fL (ref 78.0–100.0)
Monocytes Absolute: 0.5 10*3/uL (ref 0.1–1.0)
Monocytes Relative: 6 %
Neutro Abs: 5.9 10*3/uL (ref 1.7–7.7)
Neutrophils Relative %: 68 %
Platelets: 257 10*3/uL (ref 150–400)
RBC: 4.55 MIL/uL (ref 3.87–5.11)
RDW: 14.1 % (ref 11.5–15.5)
WBC: 8.6 10*3/uL (ref 4.0–10.5)

## 2017-07-30 LAB — I-STAT BETA HCG BLOOD, ED (MC, WL, AP ONLY): I-stat hCG, quantitative: <5 m[IU]/mL (ref <5)

## 2017-07-30 LAB — COMPREHENSIVE METABOLIC PANEL
ALBUMIN: 4.3 g/dL (ref 3.5–5.0)
ALT: 15 U/L (ref 14–54)
AST: 17 U/L (ref 15–41)
Alkaline Phosphatase: 85 U/L (ref 38–126)
Anion gap: 9 (ref 5–15)
BUN: 6 mg/dL (ref 6–20)
CHLORIDE: 109 mmol/L (ref 101–111)
CO2: 22 mmol/L (ref 22–32)
CREATININE: 0.82 mg/dL (ref 0.44–1.00)
Calcium: 9.5 mg/dL (ref 8.9–10.3)
GFR calc non Af Amer: >60 mL/min (ref >60)
Glucose, Bld: 106 mg/dL — ABNORMAL HIGH (ref 65–99)
Potassium: 4.1 mmol/L (ref 3.5–5.1)
Sodium: 140 mmol/L (ref 135–145)
Total Bilirubin: 0.7 mg/dL (ref 0.3–1.2)
Total Protein: 7.3 g/dL (ref 6.5–8.1)

## 2017-07-30 LAB — LIPASE, BLOOD: Lipase: 24 U/L (ref 11–51)

## 2017-07-30 LAB — I-STAT TROPONIN, ED: Troponin i, poc: 0 ng/mL (ref 0.00–0.08)

## 2017-07-30 MED ORDER — ONDANSETRON 4 MG PO TBDP
4.0000 mg | ORAL_TABLET | Freq: Once | ORAL | Status: AC
  Administered 2017-07-30: 4 mg via ORAL
  Filled 2017-07-30: qty 1

## 2017-07-30 MED ORDER — GI COCKTAIL ~~LOC~~
30.0000 mL | Freq: Once | ORAL | Status: AC
  Administered 2017-07-30: 30 mL via ORAL
  Filled 2017-07-30: qty 30

## 2017-07-30 NOTE — ED Provider Notes (Signed)
Patient placed in Quick Look pathway, seen and evaluated   Chief Complaint: Nausea & vomiting  HPI: 32 year old female with history of hypertension who presents the emergency department today for nausea and vomiting.  Patient states that 1 hour ago her symptoms started with nausea she has had 3 episodes of nonbilious, nonbloody emesis.  Patient states she has a history of the same.  Patient has not tried anything for symptoms.  Patient denies any fever, chills, abdominal pain, flank pain, urinary frequency, urinary urgency, dysuria, hematuria, diarrhea.  No previous abdominal surgeries.  No chronic NSAID use.  No marijuana use.  She notes that she has had one alcoholic beverage today including a large can of beer. LMP 07/13/17. She is still passing gas.   ROS:  Positive ROS: (+) Nausea and vomiting Negative ROS: (-) Fever, chills, abdominal pain, urinary symptoms  Physical Exam:   Gen: No distress  Neuro: Awake and Alert  Skin: Warm  Focused Exam: Heart RRR, nml S1,S2, no m/r/g; Lungs CTAB  Abd: Appearance normal. No erythema, jaundice or ascites. Abdomen is soft and non-tender to palpation. No rebound or guarding. Bowel sounds are present in all four quadrants. No distension. Negative Murphy's sign.  No McBurney's point tenderness. No CVA tenderness.   BP 135/87 (BP Location: Right Arm)   Pulse 93   Temp 98.2 F (36.8 C) (Oral)   Resp 16   LMP 07/13/2017   SpO2 98%   Plan: Based on initial evaluation, labs ARE indicated and radiology studies ARE NOT indicated.  Patient counseled on process, plan, and necessity for staying for completing the evaluation."  Initiation of care has begun. The patient has been counseled on the process, plan, and necessity for staying for the completion/evaluation, and the remainder of the medical screening examination   Denise Perry 07/30/17 Suella Broad, MD 07/31/17 [phone]

## 2017-07-30 NOTE — ED Triage Notes (Signed)
C/o tightness to center of chest, nausea, and vomiting x 1 hour.  Denies SOB.  Denies diarrhea and urinary complaints.

## 2017-07-31 MED ORDER — RANITIDINE HCL 150 MG PO TABS: 150.0000 mg | ORAL_TABLET | Freq: Two times a day (BID) | ORAL | 0 refills | Status: AC

## 2017-07-31 NOTE — ED Provider Notes (Signed)
[facility] EMERGENCY DEPARTMENT Provider Note   CSN: [id number] Arrival date & time: 07/30/17  1937     History   Chief Complaint Chief Complaint  Patient presents with  . Chest Pain  . Emesis    HPI Denise Perry is a 32 y.o. female with a hx of anxiety, tension, migraine headaches presents to the Emergency Department complaining of acute episode of emesis around 5:30 PM.  Patient reports that she ate spaghetti for dinner and then laid down.  She reports that she then felt the need to vomit.  Patient reports 3 episodes of emesis and then she developed a sharp chest pain in her central chest.  After this she became anxious and felt as if she might have a panic attack, prompting her visit to the emergency department.  Patient reports she is a smoker and does have symptoms of GERD including reflux at night.  She states that sometimes this causes her to vomit.  Patient is without known cardiac history.  Patient was given a GI cocktail here in the emergency department with some improvement of her chest pain she did have one additional episode of vomiting.  She has not vomited since that time.  No known sick contacts.  No fevers or chills, neck, neck pain, abdominal pain, diarrhea, weakness, dizziness, syncope, dysuria.  LMP: End of April.  It without cough, shortness of breath, hemoptysis, palpitations.  Is without leg swelling, calf tenderness, periods of immobilization, recent surgery or oral contraceptive usage.   The history is provided by the patient and medical records. No language interpreter was used.    Past Medical History:  Diagnosis Date  . Anxiety   . Hypertension   . Migraines     Patient Active Problem List   Diagnosis Date Noted  . Biological false positive RPR test 04/29/2017  . PID (acute pelvic inflammatory disease) 04/27/2017    History reviewed. No pertinent surgical history.   OB History    Gravida  2   Para  2   Term  2   Preterm        AB      Living  2     SAB      TAB      Ectopic      Multiple      Live Births  2        Obstetric Comments  SVD x 2         Home Medications    Prior to Admission medications   Medication Sig Start Date End Date Taking? Authorizing Provider  ondansetron (ZOFRAN) 4 MG tablet Take 1 tablet (4 mg total) by mouth every 6 (six) hours as needed for nausea or vomiting. 07/23/17   Dione Booze, MD  ranitidine (ZANTAC) 150 MG tablet Take 1 tablet (150 mg total) by mouth 2 (two) times daily. 07/31/17   Notnamed Croucher, Dahlia Client, PA-C    Family History No family history on file.  Social History Social History   Tobacco Use  . Smoking status: Current Every Day Smoker    Packs/day: 1.00  . Smokeless tobacco: Never Used  Substance Use Topics  . Alcohol use: Yes    Comment: socially   . Drug use: No     Allergies   Patient has no known allergies.   Review of Systems Review of Systems  Constitutional: Negative for appetite change, diaphoresis, fatigue, fever and unexpected weight change.  HENT: Negative for mouth sores.  Eyes: Negative for visual disturbance.  Respiratory: Negative for cough, chest tightness, shortness of breath and wheezing.   Cardiovascular: Positive for chest pain.  Gastrointestinal: Positive for vomiting. Negative for abdominal pain, constipation, diarrhea and nausea.  Endocrine: Negative for polydipsia, polyphagia and polyuria.  Genitourinary: Negative for dysuria, frequency, hematuria and urgency.  Musculoskeletal: Negative for back pain and neck stiffness.  Skin: Negative for rash.  Allergic/Immunologic: Negative for immunocompromised state.  Neurological: Negative for syncope, light-headedness and headaches.  Hematological: Does not bruise/bleed easily.  Psychiatric/Behavioral: Negative for sleep disturbance. The patient is nervous/anxious.      Physical Exam Updated Vital Signs BP 123/82 (BP Location: Right Arm)   Pulse 79   Temp  98.6 F (37 C) (Oral)   Resp 16   LMP 07/13/2017   SpO2 100%   Physical Exam  Constitutional: She appears well-developed and well-nourished. No distress.  Awake, alert, nontoxic appearance  HENT:  Head: Normocephalic and atraumatic.  Mouth/Throat: Oropharynx is clear and moist. No oropharyngeal exudate.  Eyes: Conjunctivae are normal. No scleral icterus.  Neck: Normal range of motion. Neck supple.  Cardiovascular: Normal rate, regular rhythm and intact distal pulses.  Pulmonary/Chest: Effort normal and breath sounds normal. No respiratory distress. She has no wheezes.  Equal chest expansion  Abdominal: Soft. Bowel sounds are normal. She exhibits no mass. There is no tenderness. There is no rebound and no guarding.  Musculoskeletal: Normal range of motion. She exhibits no edema.  Neurological: She is alert.  Speech is clear and goal oriented Moves extremities without ataxia  Skin: Skin is warm and dry. She is not diaphoretic.  Psychiatric: She has a normal mood and affect.  Nursing note and vitals reviewed.    ED Treatments / Results  Labs (all labs ordered are listed, but only abnormal results are displayed) Labs Reviewed  COMPREHENSIVE METABOLIC PANEL - Abnormal; Notable for the following components:      Result Value   Glucose, Bld 106 (*)    All other components within normal limits  URINALYSIS, ROUTINE W REFLEX MICROSCOPIC - Abnormal; Notable for the following components:   Hgb urine dipstick SMALL (*)    All other components within normal limits  LIPASE, BLOOD  CBC WITH DIFFERENTIAL/PLATELET  I-STAT BETA HCG BLOOD, ED ([facility], [facility], [facility] ONLY)  I-STAT TROPONIN, ED  I-STAT TROPONIN, ED    EKG EKG Interpretation  Date/Time:  Friday Jul 30 2017 20:04:50 EDT Ventricular Rate:  80 PR Interval:  162 QRS Duration: 80 QT Interval:  386 QTC Calculation: 445 R Axis:   72 Text Interpretation:  Normal sinus rhythm Normal ECG No significant change since last tracing Confirmed  by Rochele Raring [phone]) on 07/31/2017 12:48:25 AM   Radiology Dg Chest 2 View  Result Date: 07/30/2017 CLINICAL DATA:  Acute chest pain today. EXAM: CHEST - 2 VIEW COMPARISON:  11/27/2016 and prior radiographs FINDINGS: The cardiomediastinal silhouette is unremarkable. There is no evidence of focal airspace disease, pulmonary edema, suspicious pulmonary nodule/mass, pleural effusion, or pneumothorax. No acute bony abnormalities are identified. IMPRESSION: No active cardiopulmonary disease. Electronically Signed   By: Harmon Pier M.D.   On: 07/30/2017 20:44    Procedures Procedures (including critical care time)  Medications Ordered in ED Medications  ondansetron (ZOFRAN-ODT) disintegrating tablet 4 mg (4 mg Oral Given 07/30/17 2012)  gi cocktail (Maalox,Lidocaine,Donnatal) (30 mLs Oral Given 07/30/17 2012)     Initial Impression / Assessment and Plan / ED Course  I have reviewed the triage vital signs  and the nursing notes.  Pertinent labs & imaging results that were available during my care of the patient were reviewed by me and considered in my medical decision making (see chart for details).     Presents with chest pain after vomiting.  Emesis without associated abdominal pain or fevers.  Abdomen soft and nontender on exam.  Labs reassuring without evidence of sirs or sepsis.  Patient is not tachycardic and is low risk for pulmonary embolism.  Highly doubt PE.  No recent URI symptoms and troponin is negative.  No evidence of myocarditis or acute coronary syndrome.  EKG is without ischemia.  Chest x-ray without pneumonia, pulmonary edema or pneumothorax.  I personally evaluated these images.  Suspect patient symptoms were secondary to possible GERD.  Patient will be given Zantac.  Long discussion about importance of primary care follow-up.  Patient states understanding and is in agreement with the plan.  Also discussed reasons to return immediately to the emergency department.    Final  Clinical Impressions(s) / ED Diagnoses   Final diagnoses:  Nausea  Non-intractable vomiting with nausea, unspecified vomiting type  Chest pain, unspecified type    ED Discharge Orders        Ordered    ranitidine (ZANTAC) 150 MG tablet  2 times daily     07/31/17 0054       Nil Xiong, Dahlia Client, PA-C 07/31/17 0054    Ward, Layla Maw, DO 07/31/17 0105

## 2017-07-31 NOTE — Discharge Instructions (Signed)
1. Medications: zantac, usual home medications 2. Treatment: rest, drink plenty of fluids, advance diet slowly 3. Follow Up: Please followup with your primary doctor in 2 days for discussion of your diagnoses and further evaluation after today's visit; if you do not have a primary care doctor use the resource guide provided to find one; Please return to the ER for persistent vomiting, high fevers, chest pain, shortness of breath, developing abdominal pain or worsening symptoms

## 2017-08-14 ENCOUNTER — Encounter (HOSPITAL_COMMUNITY): Payer: Self-pay | Admitting: Emergency Medicine

## 2017-08-14 ENCOUNTER — Other Ambulatory Visit: Payer: Self-pay

## 2017-08-14 ENCOUNTER — Emergency Department (HOSPITAL_COMMUNITY)
Admission: EM | Admit: 2017-08-14 | Discharge: 2017-08-14 | Disposition: A | Discharge status: Home / Self Care | Payer: Self-pay | Attending: Emergency Medicine | Admitting: Emergency Medicine

## 2017-08-14 DIAGNOSIS — R1110 Vomiting, unspecified: Secondary | ICD-10-CM | POA: Insufficient documentation

## 2017-08-14 DIAGNOSIS — R42 Dizziness and giddiness: Secondary | ICD-10-CM | POA: Insufficient documentation

## 2017-08-14 DIAGNOSIS — Z5321 Procedure and treatment not carried out due to patient leaving prior to being seen by health care provider: Secondary | ICD-10-CM | POA: Insufficient documentation

## 2017-08-14 LAB — I-STAT BETA HCG BLOOD, ED (MC, WL, AP ONLY): I-stat hCG, quantitative: <5 m[IU]/mL (ref <5)

## 2017-08-14 LAB — COMPREHENSIVE METABOLIC PANEL
ALBUMIN: 4.6 g/dL (ref 3.5–5.0)
ALT: 12 U/L — ABNORMAL LOW (ref 14–54)
ANION GAP: 12 (ref 5–15)
AST: 20 U/L (ref 15–41)
Alkaline Phosphatase: 86 U/L (ref 38–126)
BUN: 9 mg/dL (ref 6–20)
CHLORIDE: 104 mmol/L (ref 101–111)
CO2: 25 mmol/L (ref 22–32)
Calcium: 9.8 mg/dL (ref 8.9–10.3)
Creatinine, Ser: 0.82 mg/dL (ref 0.44–1.00)
GFR calc Af Amer: >60 mL/min (ref >60)
GFR calc non Af Amer: >60 mL/min (ref >60)
GLUCOSE: 93 mg/dL (ref 65–99)
POTASSIUM: 4.3 mmol/L (ref 3.5–5.1)
SODIUM: 141 mmol/L (ref 135–145)
TOTAL PROTEIN: 7.5 g/dL (ref 6.5–8.1)
Total Bilirubin: 0.7 mg/dL (ref 0.3–1.2)

## 2017-08-14 LAB — CBC
HEMATOCRIT: 43.1 % (ref 36.0–46.0)
HEMOGLOBIN: 14.2 g/dL (ref 12.0–15.0)
MCH: 30.7 pg (ref 26.0–34.0)
MCHC: 32.9 g/dL (ref 30.0–36.0)
MCV: 93.1 fL (ref 78.0–100.0)
Platelets: 268 10*3/uL (ref 150–400)
RBC: 4.63 MIL/uL (ref 3.87–5.11)
RDW: 13.6 % (ref 11.5–15.5)
WBC: 12.7 10*3/uL — ABNORMAL HIGH (ref 4.0–10.5)

## 2017-08-14 LAB — LIPASE, BLOOD: LIPASE: 22 U/L (ref 11–51)

## 2017-08-14 MED ORDER — ONDANSETRON 4 MG PO TBDP
4.0000 mg | ORAL_TABLET | Freq: Once | ORAL | Status: AC | PRN
  Administered 2017-08-14: 4 mg via ORAL
  Filled 2017-08-14: qty 1

## 2017-08-14 NOTE — ED Notes (Signed)
Pt encouraged to stay and be treated pt refused triage RN aware

## 2017-08-14 NOTE — ED Triage Notes (Signed)
Patient to ED c/o sudden onset N/V today with episode of diarrhea and lightheadedness. Patient denies pain, no fevers/chills.

## 2017-09-03 ENCOUNTER — Emergency Department (HOSPITAL_COMMUNITY)
Admission: EM | Admit: 2017-09-03 | Discharge: 2017-09-03 | Disposition: A | Discharge status: Home / Self Care | Payer: Self-pay | Attending: Emergency Medicine | Admitting: Emergency Medicine

## 2017-09-03 ENCOUNTER — Encounter (HOSPITAL_COMMUNITY): Payer: Self-pay

## 2017-09-03 DIAGNOSIS — R51 Headache: Secondary | ICD-10-CM | POA: Insufficient documentation

## 2017-09-03 DIAGNOSIS — R112 Nausea with vomiting, unspecified: Secondary | ICD-10-CM | POA: Insufficient documentation

## 2017-09-03 DIAGNOSIS — R5381 Other malaise: Secondary | ICD-10-CM | POA: Insufficient documentation

## 2017-09-03 DIAGNOSIS — Z5321 Procedure and treatment not carried out due to patient leaving prior to being seen by health care provider: Secondary | ICD-10-CM | POA: Insufficient documentation

## 2017-09-03 LAB — CBC
HCT: 37.8 % (ref 36.0–46.0)
HEMOGLOBIN: 12.5 g/dL (ref 12.0–15.0)
MCH: 31.1 pg (ref 26.0–34.0)
MCHC: 33.1 g/dL (ref 30.0–36.0)
MCV: 94 fL (ref 78.0–100.0)
Platelets: 224 10*3/uL (ref 150–400)
RBC: 4.02 MIL/uL (ref 3.87–5.11)
RDW: 13.5 % (ref 11.5–15.5)
WBC: 8.5 10*3/uL (ref 4.0–10.5)

## 2017-09-03 LAB — COMPREHENSIVE METABOLIC PANEL
ALT: 12 U/L — ABNORMAL LOW (ref 14–54)
ANION GAP: 9 (ref 5–15)
AST: 15 U/L (ref 15–41)
Albumin: 3.9 g/dL (ref 3.5–5.0)
Alkaline Phosphatase: 78 U/L (ref 38–126)
BUN: 8 mg/dL (ref 6–20)
CALCIUM: 9 mg/dL (ref 8.9–10.3)
CO2: 24 mmol/L (ref 22–32)
Chloride: 111 mmol/L (ref 101–111)
Creatinine, Ser: 0.77 mg/dL (ref 0.44–1.00)
GFR calc non Af Amer: >60 mL/min (ref >60)
Glucose, Bld: 102 mg/dL — ABNORMAL HIGH (ref 65–99)
Potassium: 3.9 mmol/L (ref 3.5–5.1)
SODIUM: 144 mmol/L (ref 135–145)
Total Bilirubin: 0.7 mg/dL (ref 0.3–1.2)
Total Protein: 6.9 g/dL (ref 6.5–8.1)

## 2017-09-03 LAB — URINALYSIS, ROUTINE W REFLEX MICROSCOPIC
BILIRUBIN URINE: NEGATIVE
Bacteria, UA: NONE SEEN
GLUCOSE, UA: NEGATIVE mg/dL
KETONES UR: NEGATIVE mg/dL
LEUKOCYTES UA: NEGATIVE
Nitrite: NEGATIVE
Protein, ur: NEGATIVE mg/dL
Specific Gravity, Urine: 1.019 (ref 1.005–1.030)
pH: 7 (ref 5.0–8.0)

## 2017-09-03 LAB — I-STAT BETA HCG BLOOD, ED (MC, WL, AP ONLY): I-stat hCG, quantitative: <5 m[IU]/mL (ref <5)

## 2017-09-03 LAB — LIPASE, BLOOD: LIPASE: 24 U/L (ref 11–51)

## 2017-09-03 NOTE — ED Triage Notes (Signed)
Pt comes via [facility] for sudden onset of n/v headache and malaise. Reports vomiting x 4, denies abd pain, hx of migraines

## 2017-09-07 NOTE — ED Notes (Signed)
Follow up call made  No answer  09/07/17  1241  s Leala Bryand rn

## 2017-09-25 ENCOUNTER — Emergency Department (HOSPITAL_COMMUNITY)
Admission: EM | Admit: 2017-09-25 | Discharge: 2017-09-25 | Disposition: A | Discharge status: Home / Self Care | Payer: Self-pay | Attending: Emergency Medicine | Admitting: Emergency Medicine

## 2017-09-25 ENCOUNTER — Encounter (HOSPITAL_COMMUNITY): Payer: Self-pay | Admitting: Emergency Medicine

## 2017-09-25 ENCOUNTER — Other Ambulatory Visit: Payer: Self-pay

## 2017-09-25 ENCOUNTER — Emergency Department (HOSPITAL_COMMUNITY): Payer: Self-pay

## 2017-09-25 DIAGNOSIS — I10 Essential (primary) hypertension: Secondary | ICD-10-CM | POA: Insufficient documentation

## 2017-09-25 DIAGNOSIS — N739 Female pelvic inflammatory disease, unspecified: Secondary | ICD-10-CM | POA: Insufficient documentation

## 2017-09-25 DIAGNOSIS — F17210 Nicotine dependence, cigarettes, uncomplicated: Secondary | ICD-10-CM | POA: Insufficient documentation

## 2017-09-25 DIAGNOSIS — Z79899 Other long term (current) drug therapy: Secondary | ICD-10-CM | POA: Insufficient documentation

## 2017-09-25 DIAGNOSIS — N760 Acute vaginitis: Secondary | ICD-10-CM | POA: Insufficient documentation

## 2017-09-25 DIAGNOSIS — N941 Unspecified dyspareunia: Secondary | ICD-10-CM | POA: Insufficient documentation

## 2017-09-25 DIAGNOSIS — B9689 Other specified bacterial agents as the cause of diseases classified elsewhere: Secondary | ICD-10-CM

## 2017-09-25 HISTORY — DX: Other ovarian cyst, unspecified side: N83.299

## 2017-09-25 LAB — URINALYSIS, ROUTINE W REFLEX MICROSCOPIC
Bilirubin Urine: NEGATIVE
GLUCOSE, UA: NEGATIVE mg/dL
Ketones, ur: NEGATIVE mg/dL
Nitrite: NEGATIVE
Protein, ur: NEGATIVE mg/dL
Specific Gravity, Urine: 1.016 (ref 1.005–1.030)
pH: 5 (ref 5.0–8.0)

## 2017-09-25 LAB — CBC
HCT: 42.5 % (ref 36.0–46.0)
Hemoglobin: 14 g/dL (ref 12.0–15.0)
MCH: 31.6 pg (ref 26.0–34.0)
MCHC: 32.9 g/dL (ref 30.0–36.0)
MCV: 95.9 fL (ref 78.0–100.0)
Platelets: 240 10*3/uL (ref 150–400)
RBC: 4.43 MIL/uL (ref 3.87–5.11)
RDW: 13.5 % (ref 11.5–15.5)
WBC: 10.7 10*3/uL — ABNORMAL HIGH (ref 4.0–10.5)

## 2017-09-25 LAB — COMPREHENSIVE METABOLIC PANEL
ALBUMIN: 4 g/dL (ref 3.5–5.0)
ALK PHOS: 92 U/L (ref 38–126)
ALT: 12 U/L (ref 0–44)
AST: 15 U/L (ref 15–41)
Anion gap: 8 (ref 5–15)
BUN: 11 mg/dL (ref 6–20)
CALCIUM: 9.2 mg/dL (ref 8.9–10.3)
CO2: 23 mmol/L (ref 22–32)
CREATININE: 0.78 mg/dL (ref 0.44–1.00)
Chloride: 111 mmol/L (ref 98–111)
GFR calc non Af Amer: >60 mL/min (ref >60)
GLUCOSE: 104 mg/dL — AB (ref 70–99)
Potassium: 4.2 mmol/L (ref 3.5–5.1)
SODIUM: 142 mmol/L (ref 135–145)
Total Bilirubin: 0.5 mg/dL (ref 0.3–1.2)
Total Protein: 7.1 g/dL (ref 6.5–8.1)

## 2017-09-25 LAB — LIPASE, BLOOD: Lipase: 28 U/L (ref 11–51)

## 2017-09-25 LAB — WET PREP, GENITAL
SPERM: NONE SEEN
Trich, Wet Prep: NONE SEEN
YEAST WET PREP: NONE SEEN

## 2017-09-25 LAB — I-STAT BETA HCG BLOOD, ED (MC, WL, AP ONLY): I-stat hCG, quantitative: <5 m[IU]/mL (ref <5)

## 2017-09-25 MED ORDER — STERILE WATER FOR INJECTION IJ SOLN
INTRAMUSCULAR | Status: AC
  Administered 2017-09-25: 0.9 mL
  Filled 2017-09-25: qty 10

## 2017-09-25 MED ORDER — ACETAMINOPHEN 500 MG PO TABS
1000.0000 mg | ORAL_TABLET | Freq: Once | ORAL | Status: AC
  Administered 2017-09-25: 1000 mg via ORAL
  Filled 2017-09-25: qty 2

## 2017-09-25 MED ORDER — CEFTRIAXONE SODIUM 250 MG IJ SOLR
250.0000 mg | Freq: Once | INTRAMUSCULAR | Status: AC
  Administered 2017-09-25: 250 mg via INTRAMUSCULAR
  Filled 2017-09-25: qty 250

## 2017-09-25 MED ORDER — METRONIDAZOLE 500 MG PO TABS: 500.0000 mg | ORAL_TABLET | Freq: Two times a day (BID) | ORAL | 0 refills | Status: AC

## 2017-09-25 MED ORDER — DOXYCYCLINE HYCLATE 100 MG PO CAPS: 100.0000 mg | ORAL_CAPSULE | Freq: Two times a day (BID) | ORAL | 0 refills | Status: AC

## 2017-09-25 NOTE — ED Triage Notes (Signed)
Pt reports lower abd pain that started this am, hx of ovarian cysts, also reports some hematuria and diarrhea

## 2017-09-25 NOTE — Discharge Instructions (Signed)
You were seen in the emergency department for pelvic pain.  Ultrasound was normal and there were no cysts.  We will treat you for pelvic inflammatory disease.  You were positive for bacteria vaginosis which is a pelvic infection that is not related to sexual encounters.  Take doxycycline as prescribed.  Flagyl will treat bacterial vaginosis, do not take or drink alcohol with Flagyl as they will call severe nausea, vomiting, abdominal pain.  If you have stomach upset or nausea with antibiotics, you may take with food.  Follow-up with OB/GYN in the next 7 to 10 days if the symptoms do not improve.  Return to the ER for fevers, chills, worsening pain, localized pain to the right lower abdomen, vaginal bleeding, increased vaginal discharge, changes in your bowel movements, blood in your stools.

## 2017-09-25 NOTE — ED Provider Notes (Signed)
[facility] EMERGENCY DEPARTMENT Provider Note   CSN: [id number] Arrival date & time: 09/25/17  1324     History   Chief Complaint Chief Complaint  Patient presents with  . Abdominal Pain    HPI Denise Perry is a 32 y.o. female with history of ovarian cysts, PID is here for evaluation of bilateral lower abdominal pain that began yesterday.  Described as sharp, nonradiating, persistent.  Associated with dyspareunia, vaginal discharge that is yellow/pink in color noticed yesterday.  Last menstrual cycle ended 2 days ago.  Has taken Motrin that provides some relief but pain returns.  She is sexually active with female partner only with consistent use of condoms.  No new encounters or partners.  She denies fevers, chills, nausea, vomiting, dysuria, hematuria, urinary frequency.  No abdominal surgeries.  She was seen in the ER and diagnosed with PID and ovarian cysts February 2019.  Since, states that her pain has not bothered her but she just recently began having vaginal intercourse with her partner and it seems like the pain has returned after this.  HPI  Past Medical History:  Diagnosis Date  . Anxiety   . Functional ovarian cysts   . Hypertension   . Migraines     Patient Active Problem List   Diagnosis Date Noted  . Biological false positive RPR test 04/29/2017  . PID (acute pelvic inflammatory disease) 04/27/2017    History reviewed. No pertinent surgical history.   OB History    Gravida  2   Para  2   Term  2   Preterm      AB      Living  2     SAB      TAB      Ectopic      Multiple      Live Births  2        Obstetric Comments  SVD x 2         Home Medications    Prior to Admission medications   Medication Sig Start Date End Date Taking? Authorizing Provider  ibuprofen (ADVIL,MOTRIN) 200 MG tablet Take 400 mg by mouth every 6 (six) hours as needed for mild pain.   Yes [provider]  doxycycline (VIBRAMYCIN)  100 MG capsule Take 1 capsule (100 mg total) by mouth 2 (two) times daily for 14 days. 09/25/17 10/09/17  Liberty Handy, PA-C  metroNIDAZOLE (FLAGYL) 500 MG tablet Take 1 tablet (500 mg total) by mouth 2 (two) times daily for 7 days. 09/25/17 10/02/17  Liberty Handy, PA-C  ondansetron (ZOFRAN) 4 MG tablet Take 1 tablet (4 mg total) by mouth every 6 (six) hours as needed for nausea or vomiting. Patient not taking: Reported on 09/25/2017 07/23/17   Dione Booze, MD  ranitidine (ZANTAC) 150 MG tablet Take 1 tablet (150 mg total) by mouth 2 (two) times daily. Patient not taking: Reported on 09/25/2017 07/31/17   Muthersbaugh, Dahlia Client, PA-C    Family History No family history on file.  Social History Social History   Tobacco Use  . Smoking status: Current Every Day Smoker    Packs/day: 1.00  . Smokeless tobacco: Never Used  Substance Use Topics  . Alcohol use: Yes    Comment: socially   . Drug use: No     Allergies   Patient has no known allergies.   Review of Systems Review of Systems  Gastrointestinal: Positive for abdominal pain.  Genitourinary: Positive for dyspareunia,  pelvic pain and vaginal discharge.  All other systems reviewed and are negative.    Physical Exam Updated Vital Signs BP 130/89   Pulse 92   Temp 98.7 F (37.1 C) (Oral)   Resp 12   Ht 5\' 3"  (1.6 m)   Wt 63.5 kg (140 lb)   LMP 09/24/2017 (Exact Date)   SpO2 100%   BMI 24.80 kg/m   Physical Exam  Constitutional: She is oriented to person, place, and time. She appears well-developed and well-nourished. No distress.  Non toxic  HENT:  Head: Normocephalic and atraumatic.  Nose: Nose normal.  Moist mucous membranes   Eyes: Pupils are equal, round, and reactive to light. Conjunctivae and EOM are normal.  Neck: Normal range of motion.  Cardiovascular: Normal rate, regular rhythm and intact distal pulses.  2+ DP and radial pulses bilaterally. No LE edema.   Pulmonary/Chest: Effort normal and  breath sounds normal.  Abdominal: Soft. Bowel sounds are normal. There is no tenderness.    Very low right and left quadrant tenderness, mild.  No G/R/R. No suprapubic or CVA tenderness. Negative Murphy's and McBurney's.   Genitourinary: Pelvic exam was performed with patient prone. Cervix exhibits motion tenderness and discharge. Vaginal discharge found.  Genitourinary Comments:  External genitalia normal without erythema, edema, tenderness, or lesions.  No groin lymphadenopathy.  Mild white/yellow discharge in introitus  Vaginal mucosa normal, pink without lesions.  Moderate amount of thick, yellow discharge in vaginal vault and out of cervix Positive CMT. R adnexal tenderness without fullness. No L adnexal tenderness or fullness.   Musculoskeletal: Normal range of motion.  Neurological: She is alert and oriented to person, place, and time.  Skin: Skin is warm and dry. Capillary refill takes less than 2 seconds.  Psychiatric: She has a normal mood and affect. Her behavior is normal.  Nursing note and vitals reviewed.    ED Treatments / Results  Labs (all labs ordered are listed, but only abnormal results are displayed) Labs Reviewed  WET PREP, GENITAL - Abnormal; Notable for the following components:      Result Value   Clue Cells Wet Prep HPF POC PRESENT (*)    WBC, Wet Prep HPF POC MANY (*)    All other components within normal limits  COMPREHENSIVE METABOLIC PANEL - Abnormal; Notable for the following components:   Glucose, Bld 104 (*)    All other components within normal limits  CBC - Abnormal; Notable for the following components:   WBC 10.7 (*)    All other components within normal limits  URINALYSIS, ROUTINE W REFLEX MICROSCOPIC - Abnormal; Notable for the following components:   APPearance HAZY (*)    Hgb urine dipstick MODERATE (*)    Leukocytes, UA LARGE (*)    Bacteria, UA RARE (*)    All other components within normal limits  URINE CULTURE  LIPASE, BLOOD    I-STAT BETA HCG BLOOD, ED ([facility], [facility], [facility] ONLY)  GC/CHLAMYDIA PROBE AMP ([place]) NOT AT [facility]    EKG None  Radiology [place] Transvaginal Non-ob  Result Date: 09/25/2017 CLINICAL DATA:  Pelvic pain and dyspareunia. EXAM: TRANSABDOMINAL AND TRANSVAGINAL ULTRASOUND OF PELVIS DOPPLER ULTRASOUND OF OVARIES TECHNIQUE: Both transabdominal and transvaginal ultrasound examinations of the pelvis were performed. Transabdominal technique was performed for global imaging of the pelvis including uterus, ovaries, adnexal regions, and pelvic cul-de-sac. It was necessary to proceed with endovaginal exam following the transabdominal exam to visualize the ovaries. Color and duplex Doppler ultrasound was utilized  to evaluate blood flow to the ovaries. COMPARISON:  Pelvic ultrasound dated April 22, 2017. FINDINGS: Uterus Measurements: 10.0 x 5.3 x 5.5 cm. No fibroids or other mass visualized. Endometrium Thickness: 8 mm.  No focal abnormality visualized. Right ovary Measurements: 4.3 x 2.6 x 2.0 cm. Normal appearance/no adnexal mass. Left ovary Measurements: 3.2 x 1.4 x 2.2 cm. Normal appearance/no adnexal mass. Pulsed Doppler evaluation of both ovaries demonstrates normal low-resistance arterial and venous waveforms. Other findings Trace free fluid, likely physiologic. IMPRESSION: Normal pelvic ultrasound. Previously seen complex cystic lesions in both adnexa have resolved. Electronically Signed   By: Obie Dredge M.D.   On: 09/25/2017 14:13   US Pelvis Complete  Result Date: 09/25/2017 CLINICAL DATA:  Pelvic pain and dyspareunia. EXAM: TRANSABDOMINAL AND TRANSVAGINAL ULTRASOUND OF PELVIS DOPPLER ULTRASOUND OF OVARIES TECHNIQUE: Both transabdominal and transvaginal ultrasound examinations of the pelvis were performed. Transabdominal technique was performed for global imaging of the pelvis including uterus, ovaries, adnexal regions, and pelvic cul-de-sac. It was necessary to proceed with endovaginal exam following the  transabdominal exam to visualize the ovaries. Color and duplex Doppler ultrasound was utilized to evaluate blood flow to the ovaries. COMPARISON:  Pelvic ultrasound dated April 22, 2017. FINDINGS: Uterus Measurements: 10.0 x 5.3 x 5.5 cm. No fibroids or other mass visualized. Endometrium Thickness: 8 mm.  No focal abnormality visualized. Right ovary Measurements: 4.3 x 2.6 x 2.0 cm. Normal appearance/no adnexal mass. Left ovary Measurements: 3.2 x 1.4 x 2.2 cm. Normal appearance/no adnexal mass. Pulsed Doppler evaluation of both ovaries demonstrates normal low-resistance arterial and venous waveforms. Other findings Trace free fluid, likely physiologic. IMPRESSION: Normal pelvic ultrasound. Previously seen complex cystic lesions in both adnexa have resolved. Electronically Signed   By: Obie Dredge M.D.   On: 09/25/2017 14:13   [place] Art/ven Flow Abd Pelv Doppler  Result Date: 09/25/2017 CLINICAL DATA:  Pelvic pain and dyspareunia. EXAM: TRANSABDOMINAL AND TRANSVAGINAL ULTRASOUND OF PELVIS DOPPLER ULTRASOUND OF OVARIES TECHNIQUE: Both transabdominal and transvaginal ultrasound examinations of the pelvis were performed. Transabdominal technique was performed for global imaging of the pelvis including uterus, ovaries, adnexal regions, and pelvic cul-de-sac. It was necessary to proceed with endovaginal exam following the transabdominal exam to visualize the ovaries. Color and duplex Doppler ultrasound was utilized to evaluate blood flow to the ovaries. COMPARISON:  Pelvic ultrasound dated April 22, 2017. FINDINGS: Uterus Measurements: 10.0 x 5.3 x 5.5 cm. No fibroids or other mass visualized. Endometrium Thickness: 8 mm.  No focal abnormality visualized. Right ovary Measurements: 4.3 x 2.6 x 2.0 cm. Normal appearance/no adnexal mass. Left ovary Measurements: 3.2 x 1.4 x 2.2 cm. Normal appearance/no adnexal mass. Pulsed Doppler evaluation of both ovaries demonstrates normal low-resistance arterial and venous  waveforms. Other findings Trace free fluid, likely physiologic. IMPRESSION: Normal pelvic ultrasound. Previously seen complex cystic lesions in both adnexa have resolved. Electronically Signed   By: Obie Dredge M.D.   On: 09/25/2017 14:13    Procedures Procedures (including critical care time)  Medications Ordered in ED Medications  cefTRIAXone (ROCEPHIN) injection 250 mg (has no administration in time range)  acetaminophen (TYLENOL) tablet 1,000 mg (1,000 mg Oral Given 09/25/17 1404)     Initial Impression / Assessment and Plan / ED Course  I have reviewed the triage vital signs and the nursing notes.  Pertinent labs & imaging results that were available during my care of the patient were reviewed by me and considered in my medical decision making (see chart for details).  Clinical  Course as of Sep 26 1510  Sat Sep 25, 2017  1047 Hgb urine dipstickMarland Kitchen): MODERATE [CG]  1047 RBC: 4.43 [MB]  1049 Leukocytes, UA(!): LARGE [CG]  1049 WBC, UA: 21-50 [CG]  1049 Bacteria, UA(!): RARE [CG]  1049 Squamous Epithelial / LPF: 0-5 [CG]  1049 Mucus: PRESENT [CG]  1049 WBC(!): 10.7 [CG]  1237 GC/Chlamydia probe amp ([place])not at [facility] [[facility]]  1242 WBC, Wet Prep HPF POC(!): MANY [MB]    Clinical Course User Index [CG] Liberty Handy, PA-C [MB] Odis Luster A, Student-PA   32 year old here with pelvic pain, dyspareunia, vaginal discharge.  History of PID, STDs, ovarian cysts. Seen 04/2017 and evaluated for similar in ER, treated for cysts vs TOA? Treated with abx and f/u with OBGYN. Concern for PID versus enlargement of cysts versus torsion.    On exam she has tenderness at the very low right/left quadrants, well below McBurney's point.  Positive CMT and right adnexal tenderness without obvious fullness.  Moderate amount of yellow/thick discharge noted.    Negative pregnancy.  UA with moderate hemoglobin, large leukocytes, 2 oh 1-50 WBCs, rare bacteria and 0-5 squamous epithelials.   She denies dysuria, hematuria, frequency.  We will send urine for culture.  Clue cells noted on wet prep.  WBC 10.7.  Will give Tylenol.  Pending ultrasound.  Did consider TOA however unlikely given no fever, chills.  No bloody diarrhea, h/o diverticulitis.  [place] negative. Re-evaluated pt after tylenol and pain has improved.  Discussed plan to dc with tx for PID, BV, NSAIDs.  Recommended f/u with OBGYN in 7-10 days for re-evaluation if symptoms persist. Discussed return precautions. Pt is in agreement.  Final Clinical Impressions(s) / ED Diagnoses   Final diagnoses:  Pelvic inflammatory disease  Dyspareunia in female  Bacterial vaginosis    ED Discharge Orders        Ordered    doxycycline (VIBRAMYCIN) 100 MG capsule  2 times daily     09/25/17 1453    metroNIDAZOLE (FLAGYL) 500 MG tablet  2 times daily     09/25/17 1453       Jerrell Mylar 09/25/17 1512    Shaune Pollack, MD 09/26/17 1213

## 2017-09-26 LAB — URINE CULTURE

## 2017-09-27 LAB — GC/CHLAMYDIA PROBE AMP (~~LOC~~) NOT AT ARMC
Chlamydia: NEGATIVE
Neisseria Gonorrhea: POSITIVE — AB

## 2017-11-13 ENCOUNTER — Emergency Department (HOSPITAL_COMMUNITY)
Admission: EM | Admit: 2017-11-13 | Discharge: 2017-11-14 | Disposition: A | Discharge status: Home / Self Care | Payer: Self-pay | Attending: Emergency Medicine | Admitting: Emergency Medicine

## 2017-11-13 ENCOUNTER — Encounter (HOSPITAL_COMMUNITY): Payer: Self-pay | Admitting: Emergency Medicine

## 2017-11-13 DIAGNOSIS — T40995A Adverse effect of other psychodysleptics [hallucinogens], initial encounter: Secondary | ICD-10-CM | POA: Insufficient documentation

## 2017-11-13 DIAGNOSIS — I10 Essential (primary) hypertension: Secondary | ICD-10-CM | POA: Insufficient documentation

## 2017-11-13 DIAGNOSIS — T887XXA Unspecified adverse effect of drug or medicament, initial encounter: Secondary | ICD-10-CM | POA: Insufficient documentation

## 2017-11-13 DIAGNOSIS — F17200 Nicotine dependence, unspecified, uncomplicated: Secondary | ICD-10-CM | POA: Insufficient documentation

## 2017-11-13 DIAGNOSIS — F1610 Hallucinogen abuse, uncomplicated: Secondary | ICD-10-CM

## 2017-11-13 DIAGNOSIS — Y829 Unspecified medical devices associated with adverse incidents: Secondary | ICD-10-CM | POA: Insufficient documentation

## 2017-11-13 MED ORDER — SODIUM CHLORIDE 0.9 % IV BOLUS
1000.0000 mL | Freq: Once | INTRAVENOUS | Status: AC
  Administered 2017-11-14: 1000 mL via INTRAVENOUS

## 2017-11-13 MED ORDER — ONDANSETRON 4 MG PO TBDP
4.0000 mg | ORAL_TABLET | Freq: Once | ORAL | Status: AC
  Administered 2017-11-13: 4 mg via ORAL
  Filled 2017-11-13: qty 1

## 2017-11-13 NOTE — ED Triage Notes (Signed)
Per report pt took "a half a ecstasy " last night and is still feeling the effects today.  Per EMS, pt was very anxious upon arrival, hyperventilating, diaphoretic w/ a high heart rate however pt was calm and relaxed upon arrival.  Pt reports she was having a panic attack on top of the drug use (tight chest, n/v, hot feeling and shakie feelings.)

## 2017-11-14 LAB — CBC
HEMATOCRIT: 42.2 % (ref 36.0–46.0)
Hemoglobin: 14 g/dL (ref 12.0–15.0)
MCH: 31.7 pg (ref 26.0–34.0)
MCHC: 33.2 g/dL (ref 30.0–36.0)
MCV: 95.7 fL (ref 78.0–100.0)
Platelets: 235 10*3/uL (ref 150–400)
RBC: 4.41 MIL/uL (ref 3.87–5.11)
RDW: 13.6 % (ref 11.5–15.5)
WBC: 10.1 10*3/uL (ref 4.0–10.5)

## 2017-11-14 LAB — BASIC METABOLIC PANEL
Anion gap: 13 (ref 5–15)
BUN: 7 mg/dL (ref 6–20)
CO2: 20 mmol/L — AB (ref 22–32)
CREATININE: 0.82 mg/dL (ref 0.44–1.00)
Calcium: 9.2 mg/dL (ref 8.9–10.3)
Chloride: 104 mmol/L (ref 98–111)
GFR calc non Af Amer: >60 mL/min (ref >60)
Glucose, Bld: 93 mg/dL (ref 70–99)
Potassium: 3.8 mmol/L (ref 3.5–5.1)
Sodium: 137 mmol/L (ref 135–145)

## 2017-11-14 LAB — I-STAT BETA HCG BLOOD, ED (MC, WL, AP ONLY)

## 2017-11-14 NOTE — Discharge Instructions (Addendum)
1. Medications: do not use Ecstasy again; usual home medications 2. Treatment: rest, drink plenty of fluids,  3. Follow Up: Please followup with your primary doctor in 2-3 days for discussion of your diagnoses and further evaluation after today's visit; if you do not have a primary care doctor use the resource guide provided to find one; Please return to the ER for return of symptoms or other concerns

## 2017-11-14 NOTE — ED Provider Notes (Signed)
[facility] EMERGENCY DEPARTMENT Provider Note   CSN: [id number] Arrival date & time: 11/13/17  2104     History   Chief Complaint Chief Complaint  Patient presents with  . drug exposure    HPI Denise Perry is a 32 y.o. female with a hx of anxiety, HTN, migraine headache presents to the Emergency Department complaining of gradual, persistent, "feeling off" onset this morning.  Pt reports that last night she took a tablet of esctasy with 4+ shots of liquor.  She reports this is the first time she has taken the drug.  She is adamant that this is what she took and denies any other drug usage.  She reports today she felt unwell and was vomiting.  She reports several episodes of nonbloody nonbilious emesis out the day.  She reports she feels dehydrated.  Patient also reports feeling lightheaded and dizzy when she walks.  She states tonight she became very worried about this and had a panic attack.  She reports that during her panic attack she felt short of breath, was sweaty, had palpitations and felt her hands cramping up.  She reports she has a history of panic attacks and this was the same as her previous.  She denied any chest pain prior to her panic attack and has no chest pain at this time.  Patient reports she drinks intermittently but denies regular drug usage.  No specific aggravating or alleviating factors.  No treatments prior to arrival.  She reports she was given Zofran upon arrival here in the emergency department and that has helped her nausea.   The history is provided by the patient and medical records. No language interpreter was used.    Past Medical History:  Diagnosis Date  . Anxiety   . Functional ovarian cysts   . Hypertension   . Migraines     Patient Active Problem List   Diagnosis Date Noted  . Biological false positive RPR test 04/29/2017  . PID (acute pelvic inflammatory disease) 04/27/2017    History reviewed. No pertinent surgical  history.   OB History    Gravida  2   Para  2   Term  2   Preterm      AB      Living  2     SAB      TAB      Ectopic      Multiple      Live Births  2        Obstetric Comments  SVD x 2         Home Medications    Prior to Admission medications   Medication Sig Start Date End Date Taking? Authorizing Provider  ibuprofen (ADVIL,MOTRIN) 200 MG tablet Take 400 mg by mouth every 6 (six) hours as needed for mild pain.    [provider]  ondansetron (ZOFRAN) 4 MG tablet Take 1 tablet (4 mg total) by mouth every 6 (six) hours as needed for nausea or vomiting. Patient not taking: Reported on 09/25/2017 07/23/17   Dione Booze, MD  ranitidine (ZANTAC) 150 MG tablet Take 1 tablet (150 mg total) by mouth 2 (two) times daily. Patient not taking: Reported on 09/25/2017 07/31/17   Jonnae Fonseca, Dahlia Client, PA-C    Family History No family history on file.  Social History Social History   Tobacco Use  . Smoking status: Current Every Day Smoker    Packs/day: 1.00  . Smokeless tobacco: Never Used  Substance  Use Topics  . Alcohol use: Yes    Comment: socially   . Drug use: No     Allergies   Patient has no known allergies.   Review of Systems Review of Systems  Constitutional: Positive for fatigue. Negative for appetite change, diaphoresis, fever and unexpected weight change.  HENT: Negative for mouth sores.   Eyes: Negative for visual disturbance.  Respiratory: Positive for shortness of breath ( Only with anxiety). Negative for cough, chest tightness and wheezing.   Cardiovascular: Positive for palpitations ( With anxiety). Negative for chest pain.  Gastrointestinal: Positive for nausea and vomiting. Negative for abdominal pain, constipation and diarrhea.  Endocrine: Negative for polydipsia, polyphagia and polyuria.  Genitourinary: Negative for dysuria, frequency, hematuria and urgency.  Musculoskeletal: Negative for back pain and neck stiffness.    Skin: Negative for rash.  Allergic/Immunologic: Negative for immunocompromised state.  Neurological: Positive for light-headedness. Negative for syncope and headaches.  Hematological: Does not bruise/bleed easily.  Psychiatric/Behavioral: Negative for sleep disturbance. The patient is not nervous/anxious.      Physical Exam Updated Vital Signs BP (!) 140/97 (BP Location: Left Arm)   Pulse 89   Temp 98.8 F (37.1 C) (Oral)   Resp 18   SpO2 100%   Physical Exam  Constitutional: She appears well-developed and well-nourished. No distress.  Awake, alert, nontoxic appearance  HENT:  Head: Normocephalic and atraumatic.  Mouth/Throat: Oropharynx is clear and moist. No oropharyngeal exudate.  Eyes: Conjunctivae are normal. No scleral icterus.  Neck: Normal range of motion. Neck supple.  Cardiovascular: Normal rate, regular rhythm and intact distal pulses.  Pulmonary/Chest: Effort normal and breath sounds normal. No respiratory distress. She has no wheezes.  Equal chest expansion  Abdominal: Soft. Bowel sounds are normal. She exhibits no mass. There is no tenderness. There is no rebound and no guarding.  Musculoskeletal: Normal range of motion. She exhibits no edema.  Neurological: She is alert.  Speech is clear and goal oriented Moves extremities without ataxia  Skin: Skin is warm and dry. She is not diaphoretic.  Psychiatric: She has a normal mood and affect.  Nursing note and vitals reviewed.    ED Treatments / Results  Labs (all labs ordered are listed, but only abnormal results are displayed) Labs Reviewed  BASIC METABOLIC PANEL - Abnormal; Notable for the following components:      Result Value   CO2 20 (*)    All other components within normal limits  CBC  I-STAT BETA HCG BLOOD, ED ([facility], [facility], [facility] ONLY)    EKG EKG Interpretation  Date/Time:  Saturday November 13 2017 21:17:39 EDT Ventricular Rate:  88 PR Interval:  158 QRS Duration: 86 QT Interval:  370 QTC  Calculation: 447 R Axis:   127 Text Interpretation:  Suspect arm lead reversal, interpretation assumes no reversal ; will repeat Normal sinus rhythm Lateral infarct , age undetermined Abnormal ECG Confirmed by Drema Pry [phone]) on 11/14/2017 12:05:08 AM   EKG Interpretation  Date/Time:  Sunday November 14 2017 00:12:42 EDT Ventricular Rate:  73 PR Interval:  158 QRS Duration: 93 QT Interval:  391 QTC Calculation: 431 R Axis:   66 Text Interpretation:  Sinus rhythm Anteroseptal infarct, age indeterminate lead placement resolved. Otherwise no significant change Confirmed by Drema Pry [phone]) on 11/14/2017 12:18:22 AM       Procedures Procedures (including critical care time)  Medications Ordered in ED Medications  ondansetron (ZOFRAN-ODT) disintegrating tablet 4 mg (4 mg Oral Given 11/13/17 2124)  sodium  chloride 0.9 % bolus 1,000 mL (1,000 mLs Intravenous New Bag/Given 11/14/17 0014)     Initial Impression / Assessment and Plan / ED Course  I have reviewed the triage vital signs and the nursing notes.  Pertinent labs & imaging results that were available during my care of the patient were reviewed by me and considered in my medical decision making (see chart for details).  Clinical Course as of Nov 14 117  Wynelle Link Nov 14, 2017  0116 She is well-appearing.  She states she feels significantly better.  She has tolerated p.o. here in the emergency department without vomiting.  She ambulates with steady gait and does not continue to feel lightheaded.   [HM]  0117 Normal  WBC: 10.1 [HM]  0117 Normal  Creatinine: 0.82 [HM]  0117 Normal  Potassium: 3.8 [HM]  0117 Normal  Anion gap: 13 [HM]    Clinical Course User Index [HM] Caitlin Ainley, Dahlia Client, PA-C    Presents with concerns of lightheadedness, vomiting after drug and alcohol usage.  She is well-appearing on my exam.  She does report an episode of anxiety and per EMS patient was hyperventilating, tachycardic and diaphoretic  upon their arrival.  Her physical exam is reassuring, her abdomen is soft and nontender.  She is not tachycardic on arrival here to the emergency department or upon my physical exam.  Her labs are reassuring.  Patient given fluid bolus with significant improvement.  She is ambulatory here in the emergency department without persistent lightheadedness or dizziness.  No syncope.  She is tolerated p.o. without difficulty.  Suspect patient's symptoms were secondary to mixing ecstasy and alcohol in addition to anxiety.  Her EKG is without evidence of ischemia.  Highly doubt acute coronary syndrome.  Discussed with patient the importance of not taking ecstasy and especially not mixing drugs and alcohol.  She states understanding.  She will be discharged home.  Discussed reasons to return immediately to the emergency department.  Patient states understanding and is in agreement with this plan.  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

## 2018-03-14 ENCOUNTER — Emergency Department (HOSPITAL_COMMUNITY): Payer: Self-pay

## 2018-03-14 ENCOUNTER — Emergency Department (HOSPITAL_COMMUNITY)
Admission: EM | Admit: 2018-03-14 | Discharge: 2018-03-14 | Disposition: A | Discharge status: Home / Self Care | Payer: Self-pay | Attending: Emergency Medicine | Admitting: Emergency Medicine

## 2018-03-14 ENCOUNTER — Encounter (HOSPITAL_COMMUNITY): Payer: Self-pay | Admitting: Emergency Medicine

## 2018-03-14 DIAGNOSIS — R079 Chest pain, unspecified: Secondary | ICD-10-CM

## 2018-03-14 DIAGNOSIS — R112 Nausea with vomiting, unspecified: Secondary | ICD-10-CM | POA: Insufficient documentation

## 2018-03-14 DIAGNOSIS — I10 Essential (primary) hypertension: Secondary | ICD-10-CM | POA: Insufficient documentation

## 2018-03-14 DIAGNOSIS — R0789 Other chest pain: Secondary | ICD-10-CM | POA: Insufficient documentation

## 2018-03-14 DIAGNOSIS — F17200 Nicotine dependence, unspecified, uncomplicated: Secondary | ICD-10-CM | POA: Insufficient documentation

## 2018-03-14 LAB — I-STAT BETA HCG BLOOD, ED (MC, WL, AP ONLY): I-stat hCG, quantitative: <5 m[IU]/mL (ref <5)

## 2018-03-14 LAB — CBC
HCT: 40 % (ref 36.0–46.0)
Hemoglobin: 13 g/dL (ref 12.0–15.0)
MCH: 30.9 pg (ref 26.0–34.0)
MCHC: 32.5 g/dL (ref 30.0–36.0)
MCV: 95 fL (ref 80.0–100.0)
Platelets: 257 10*3/uL (ref 150–400)
RBC: 4.21 MIL/uL (ref 3.87–5.11)
RDW: 13.7 % (ref 11.5–15.5)
WBC: 9.4 10*3/uL (ref 4.0–10.5)
nRBC: 0 % (ref 0.0–0.2)

## 2018-03-14 LAB — BASIC METABOLIC PANEL
Anion gap: 8 (ref 5–15)
BUN: 6 mg/dL (ref 6–20)
CHLORIDE: 110 mmol/L (ref 98–111)
CO2: 24 mmol/L (ref 22–32)
Calcium: 9.1 mg/dL (ref 8.9–10.3)
Creatinine, Ser: 0.73 mg/dL (ref 0.44–1.00)
GFR calc Af Amer: >60 mL/min (ref >60)
GFR calc non Af Amer: >60 mL/min (ref >60)
GLUCOSE: 117 mg/dL — AB (ref 70–99)
Potassium: 3.9 mmol/L (ref 3.5–5.1)
SODIUM: 142 mmol/L (ref 135–145)

## 2018-03-14 LAB — I-STAT TROPONIN, ED: Troponin i, poc: 0 ng/mL (ref 0.00–0.08)

## 2018-03-14 MED ORDER — ALUM & MAG HYDROXIDE-SIMETH 200-200-20 MG/5ML PO SUSP
30.0000 mL | Freq: Once | ORAL | Status: AC
  Administered 2018-03-14: 30 mL via ORAL
  Filled 2018-03-14: qty 30

## 2018-03-14 MED ORDER — LIDOCAINE VISCOUS HCL 2 % MT SOLN
15.0000 mL | Freq: Once | OROMUCOSAL | Status: AC
  Administered 2018-03-14: 15 mL via ORAL
  Filled 2018-03-14: qty 15

## 2018-03-14 MED ORDER — PROMETHAZINE HCL 25 MG PO TABS
25.0000 mg | ORAL_TABLET | Freq: Once | ORAL | Status: AC
  Administered 2018-03-14: 25 mg via ORAL
  Filled 2018-03-14: qty 1

## 2018-03-14 NOTE — ED Provider Notes (Signed)
[facility]ENC[facility] Provider Note   CSN: [id number] Arrival date & time: 03/14/18  2042     History   Chief Complaint Chief Complaint  Patient presents with  . Chest Pain    HPI Denise Perry is a 32 y.o. female.  HPI  233 32-year-old female history of anxiety, hypertension, migraines presents today complaining of sharp chest pain that began approximately an hour ago.  States that she was laying down.  She then began having some sharp pain.  She denies any dyspnea, cough, fever,.  She endorses vomiting.  States she vomited 4 times after the start of chest pain began.  He was given Zofran and aspirin prehospital by EMS.  She states that the pain is still present.  She is shaking and tearful.  Past Medical History:  Diagnosis Date  . Anxiety   . Functional ovarian cysts   . Hypertension   . Migraines     Patient Active Problem List   Diagnosis Date Noted  . Biological false positive RPR test 04/29/2017  . PID (acute pelvic inflammatory disease) 04/27/2017    History reviewed. No pertinent surgical history.   OB History    Gravida  2   Para  2   Term  2   Preterm      AB      Living  2     SAB      TAB      Ectopic      Multiple      Live Births  2        Obstetric Comments  SVD x 2         Home Medications    Prior to Admission medications   Medication Sig Start Date End Date Taking? Authorizing Provider  ibuprofen (ADVIL,MOTRIN) 200 MG tablet Take 400 mg by mouth every 6 (six) hours as needed for mild pain.    [provider]  ondansetron (ZOFRAN) 4 MG tablet Take 1 tablet (4 mg total) by mouth every 6 (six) hours as needed for nausea or vomiting. Patient not taking: Reported on 09/25/2017 07/23/17   Dione BoozeGlick, David, MD  ranitidine (ZANTAC) 150 MG tablet Take 1 tablet (150 mg total) by mouth 2 (two) times daily. Patient not taking: Reported on 09/25/2017 07/31/17   Muthersbaugh, Dahlia ClientHannah, PA-C    Family  History No family history on file.  Social History Social History   Tobacco Use  . Smoking status: Current Every Day Smoker    Packs/day: 1.00  . Smokeless tobacco: Never Used  Substance Use Topics  . Alcohol use: Yes    Comment: socially   . Drug use: No     Allergies   Patient has no known allergies.   Review of Systems Review of Systems  All other systems reviewed and are negative.    Physical Exam Updated Vital Signs BP 124/82 (BP Location: Right Arm)   Pulse 80   Temp 98.3 F (36.8 C) (Oral)   Resp 20   Ht 1.6 m (5\' 3" )   Wt 63.5 kg   SpO2 98%   BMI 24.80 kg/m   Physical Exam Vitals signs and nursing note reviewed.  Constitutional:      General: She is in acute distress.     Appearance: She is well-developed.  HENT:     Head: Normocephalic.  Eyes:     Pupils: Pupils are equal, round, and reactive to light.  Neck:  Musculoskeletal: Normal range of motion and neck supple.  Cardiovascular:     Rate and Rhythm: Normal rate and regular rhythm.     Heart sounds: Normal heart sounds.  Pulmonary:     Effort: Pulmonary effort is normal.     Breath sounds: Normal breath sounds.  Chest:     Chest wall: Tenderness present. No deformity.    Musculoskeletal: Normal range of motion.        General: No swelling or tenderness.     Right lower leg: No edema.     Left lower leg: No edema.  Skin:    General: Skin is warm and dry.     Capillary Refill: Capillary refill takes less than 2 seconds.  Neurological:     General: No focal deficit present.     Mental Status: She is alert.  Psychiatric:        Mood and Affect: Mood normal.        Behavior: Behavior normal.      ED Treatments / Results  Labs (all labs ordered are listed, but only abnormal results are displayed) Labs Reviewed  CBC  BASIC METABOLIC PANEL  I-STAT TROPONIN, ED  I-STAT BETA HCG BLOOD, ED ([facility], [facility], [facility] ONLY)    EKG EKG Interpretation  Date/Time:  Monday March 14 2018  20:55:16 EST Ventricular Rate:  78 PR Interval:    QRS Duration: 96 QT Interval:  392 QTC Calculation: 447 R Axis:   59 Text Interpretation:  Normal sinus rhythm Normal ECG Confirmed by Margarita Grizzleay, Prajwal Fellner [phone]([id number]) on 03/14/2018 9:11:33 PM   Radiology No results found.  Procedures Procedures (including critical care time)  Medications Ordered in ED Medications  promethazine (PHENERGAN) tablet 25 mg (has no administration in time range)  alum & mag hydroxide-simeth (MAALOX/MYLANTA) 200-200-20 MG/5ML suspension 30 mL (has no administration in time range)    And  lidocaine (XYLOCAINE) 2 % viscous mouth solution 15 mL (has no administration in time range)     Initial Impression / Assessment and Plan / ED Course  I have reviewed the triage vital signs and the nursing notes.  Pertinent labs & imaging results that were available during my care of the patient were reviewed by me and considered in my medical decision making (see chart for details).     32 year old female presents today with sharp chest pain reproducible on exam.  She had some associated nausea and vomiting.  She is received Zofran prior to my evaluation and is not vomiting here on my exam or while in the ED 9:55 PM Patient  feels improved after GI cocktail Low index suspicion cad, pe, aortic dissection, aaa - patient improved with gi cocktail and antiemetics  Plan discharge Advised re f/u and return precautions.   Final Clinical Impressions(s) / ED Diagnoses   Final diagnoses:  Nonspecific chest pain    ED Discharge Orders    None       Margarita Grizzleay, Misti Towle, MD 03/14/18 2159

## 2018-03-14 NOTE — ED Triage Notes (Signed)
BIB EMS from home, reports onset of CP while laying in bed with N/V. Central, non radiating. Sharp, 8/10. Given 4mg  Zofran en route, 1NTG, 324 ASA.

## 2018-09-13 ENCOUNTER — Emergency Department (HOSPITAL_COMMUNITY)
Admission: EM | Admit: 2018-09-13 | Discharge: 2018-09-13 | Discharge status: Against Medical Advice | Payer: Self-pay | Attending: Emergency Medicine | Admitting: Emergency Medicine

## 2018-09-13 ENCOUNTER — Encounter (HOSPITAL_COMMUNITY): Payer: Self-pay | Admitting: Emergency Medicine

## 2018-09-13 ENCOUNTER — Other Ambulatory Visit: Payer: Self-pay

## 2018-09-13 ENCOUNTER — Encounter (HOSPITAL_COMMUNITY): Payer: Self-pay | Admitting: *Deleted

## 2018-09-13 ENCOUNTER — Emergency Department (HOSPITAL_COMMUNITY)
Admission: EM | Admit: 2018-09-13 | Discharge: 2018-09-14 | Disposition: A | Discharge status: Home / Self Care | Payer: Self-pay | Attending: Emergency Medicine | Admitting: Emergency Medicine

## 2018-09-13 DIAGNOSIS — R112 Nausea with vomiting, unspecified: Secondary | ICD-10-CM | POA: Insufficient documentation

## 2018-09-13 DIAGNOSIS — F17210 Nicotine dependence, cigarettes, uncomplicated: Secondary | ICD-10-CM | POA: Insufficient documentation

## 2018-09-13 DIAGNOSIS — R82998 Other abnormal findings in urine: Secondary | ICD-10-CM | POA: Insufficient documentation

## 2018-09-13 DIAGNOSIS — I10 Essential (primary) hypertension: Secondary | ICD-10-CM | POA: Insufficient documentation

## 2018-09-13 DIAGNOSIS — R197 Diarrhea, unspecified: Secondary | ICD-10-CM | POA: Insufficient documentation

## 2018-09-13 DIAGNOSIS — Z5321 Procedure and treatment not carried out due to patient leaving prior to being seen by health care provider: Secondary | ICD-10-CM | POA: Insufficient documentation

## 2018-09-13 LAB — URINALYSIS, ROUTINE W REFLEX MICROSCOPIC
Bilirubin Urine: NEGATIVE
Glucose, UA: NEGATIVE mg/dL
Ketones, ur: NEGATIVE mg/dL
Leukocytes,Ua: NEGATIVE
Nitrite: NEGATIVE
Protein, ur: NEGATIVE mg/dL
Specific Gravity, Urine: 1.017 (ref 1.005–1.030)
pH: 7 (ref 5.0–8.0)

## 2018-09-13 LAB — CBC
HCT: 40.2 % (ref 36.0–46.0)
HCT: 41.3 % (ref 36.0–46.0)
Hemoglobin: 13.5 g/dL (ref 12.0–15.0)
Hemoglobin: 13.7 g/dL (ref 12.0–15.0)
MCH: 31.6 pg (ref 26.0–34.0)
MCH: 31.9 pg (ref 26.0–34.0)
MCHC: 33.6 g/dL (ref 30.0–36.0)
MCHC: 33.2 g/dL (ref 30.0–36.0)
MCV: 94.1 fL (ref 80.0–100.0)
MCV: 96 fL (ref 80.0–100.0)
Platelets: 268 10*3/uL (ref 150–400)
Platelets: 277 10*3/uL (ref 150–400)
RBC: 4.27 MIL/uL (ref 3.87–5.11)
RBC: 4.3 MIL/uL (ref 3.87–5.11)
RDW: 13.8 % (ref 11.5–15.5)
RDW: 13.9 % (ref 11.5–15.5)
WBC: 13.2 10*3/uL — ABNORMAL HIGH (ref 4.0–10.5)
WBC: 15.9 10*3/uL — ABNORMAL HIGH (ref 4.0–10.5)
nRBC: 0 % (ref 0.0–0.2)
nRBC: 0 % (ref 0.0–0.2)

## 2018-09-13 LAB — COMPREHENSIVE METABOLIC PANEL
ALT: 17 U/L (ref 0–44)
AST: 23 U/L (ref 15–41)
Albumin: 4.3 g/dL (ref 3.5–5.0)
Alkaline Phosphatase: 79 U/L (ref 38–126)
Anion gap: 9 (ref 5–15)
BUN: 10 mg/dL (ref 6–20)
CO2: 23 mmol/L (ref 22–32)
Calcium: 9.4 mg/dL (ref 8.9–10.3)
Chloride: 109 mmol/L (ref 98–111)
Creatinine, Ser: 0.78 mg/dL (ref 0.44–1.00)
GFR calc Af Amer: >60 mL/min (ref >60)
GFR calc non Af Amer: >60 mL/min (ref >60)
Glucose, Bld: 93 mg/dL (ref 70–99)
Potassium: 3.9 mmol/L (ref 3.5–5.1)
Sodium: 141 mmol/L (ref 135–145)
Total Bilirubin: 0.8 mg/dL (ref 0.3–1.2)
Total Protein: 7.2 g/dL (ref 6.5–8.1)

## 2018-09-13 LAB — LIPASE, BLOOD: Lipase: 22 U/L (ref 11–51)

## 2018-09-13 LAB — POC URINE PREG, ED: Preg Test, Ur: NEGATIVE

## 2018-09-13 MED ORDER — ONDANSETRON HCL 4 MG/2ML IJ SOLN: 4.0000 mg | Freq: Once | INTRAMUSCULAR | Status: DC

## 2018-09-13 MED ORDER — SODIUM CHLORIDE 0.9% FLUSH: 3.0000 mL | Freq: Once | INTRAVENOUS | Status: DC

## 2018-09-13 MED ORDER — SODIUM CHLORIDE 0.9 % IV BOLUS
1000.0000 mL | Freq: Once | INTRAVENOUS | Status: AC
  Administered 2018-09-14: 1000 mL via INTRAVENOUS

## 2018-09-13 MED ORDER — ONDANSETRON 4 MG PO TBDP
4.0000 mg | ORAL_TABLET | Freq: Once | ORAL | Status: AC | PRN
  Administered 2018-09-13: 4 mg via ORAL
  Filled 2018-09-13: qty 1

## 2018-09-13 NOTE — ED Notes (Signed)
Pt came to nurse first to advise she is leaving.  Encouraged pt to stay and be seen by provider, pt declined.  Advised to return it symptoms persists or worsens.  Pt verbalized understanding.

## 2018-09-13 NOTE — ED Triage Notes (Signed)
Pt reported n/v today with mild diarrhea. Denies all other symptoms, denies fever. No acute distress noted at triage.

## 2018-09-13 NOTE — ED Triage Notes (Signed)
Pt reports having nausea and vomiting with mild diarrhea today that started around 1800.

## 2018-09-13 NOTE — ED Provider Notes (Signed)
[facility]-EMERGENCY DEPT Provider Note   CSN: [id number] Arrival date & time: 09/13/18  2240    History   Chief Complaint Chief Complaint  Patient presents with  . Emesis    HPI Denise Perry is a 33 y.o. female with a PMH of Anxiety, Functional Ovarian Cysts, HTN, and Migraines presenting with nausea, vomiting, and diarrhea onset today at 6pm. Patient reports she was sleeping and started to have a panic attack prior to onset of symptoms. Patient reports 5 episodes of vomiting and 1 episode of [place] diarrhea. Patient reports symptoms have improved prior to arrival. Patient reports mild nausea at this time. Patient states she last ate chicken at lunch. Patient denies fever, chills, abdominal pain, cough, congestion, or urinary symptoms. Patient denies shortness of breath or chest pain. Patient denies sick contacts. Patient states she drank a shot of vodka today during lunch. Patient reports tobacco use. Patient denies drug use. Patient reports [facility], but the wait was too long and she decided to come to [facility].     HPI  Past Medical History:  Diagnosis Date  . Anxiety   . Functional ovarian cysts   . Hypertension   . Migraines     Patient Active Problem List   Diagnosis Date Noted  . Biological false positive RPR test 04/29/2017  . PID (acute pelvic inflammatory disease) 04/27/2017    History reviewed. No pertinent surgical history.   OB History    Gravida  2   Para  2   Term  2   Preterm      AB      Living  2     SAB      TAB      Ectopic      Multiple      Live Births  2        Obstetric Comments  SVD x 2         Home Medications    Prior to Admission medications   Medication Sig Start Date End Date Taking? Authorizing Provider  ibuprofen (ADVIL,MOTRIN) 200 MG tablet Take 400 mg by mouth every 6 (six) hours as needed for mild pain.    [provider]  ondansetron (ZOFRAN)  4 MG tablet Take 1 tablet (4 mg total) by mouth every 8 (eight) hours as needed for nausea or vomiting. 09/14/18   Carlyle BasquesHernandez, Mayling Aber P, PA-C  ranitidine (ZANTAC) 150 MG tablet Take 1 tablet (150 mg total) by mouth 2 (two) times daily. Patient not taking: Reported on 09/25/2017 07/31/17   Muthersbaugh, Dahlia ClientHannah, PA-C    Family History History reviewed. No pertinent family history.  Social History Social History   Tobacco Use  . Smoking status: Current Every Day Smoker    Packs/day: 1.00  . Smokeless tobacco: Never Used  Substance Use Topics  . Alcohol use: Yes    Comment: socially   . Drug use: No     Allergies   Patient has no known allergies.   Review of Systems Review of Systems  Constitutional: Negative for activity change, appetite change, chills, fever and unexpected weight change.  HENT: Negative for congestion, rhinorrhea and sore throat.   Eyes: Negative for visual disturbance.  Respiratory: Negative for cough and shortness of breath.   Cardiovascular: Negative for chest pain.  Gastrointestinal: Positive for diarrhea, nausea and vomiting. Negative for abdominal pain and constipation.  Endocrine: Negative for polydipsia, polyphagia and polyuria.  Genitourinary: Negative  for dysuria, flank pain, frequency, pelvic pain, vaginal bleeding and vaginal discharge.  Musculoskeletal: Negative for back pain.  Skin: Negative for rash.  Allergic/Immunologic: Negative for immunocompromised state.  Neurological: Negative for dizziness, syncope, weakness, light-headedness, numbness and headaches.  Psychiatric/Behavioral: The patient is not nervous/anxious.     Physical Exam Updated Vital Signs BP 129/86 (BP Location: Left Arm)   Pulse 65   Temp 98.2 F (36.8 C) (Oral)   Resp 19   Ht 5\' 3"  (1.6 m)   Wt 63.5 kg   LMP 09/06/2018   SpO2 100%   BMI 24.80 kg/m   Physical Exam Vitals signs and nursing note reviewed.  Constitutional:      General: She is not in acute distress.     Appearance: She is well-developed. She is not diaphoretic.  HENT:     Head: Normocephalic and atraumatic.     Nose: Nose normal. No congestion or rhinorrhea.     Mouth/Throat:     Mouth: Mucous membranes are dry.     Pharynx: No oropharyngeal exudate or posterior oropharyngeal erythema.  Eyes:     Extraocular Movements: Extraocular movements intact.     Conjunctiva/sclera: Conjunctivae normal.     Pupils: Pupils are equal, round, and reactive to light.  Neck:     Musculoskeletal: Normal range of motion and neck supple.  Cardiovascular:     Rate and Rhythm: Normal rate and regular rhythm.     Heart sounds: Normal heart sounds. No murmur. No friction rub. No gallop.   Pulmonary:     Effort: Pulmonary effort is normal. No respiratory distress.     Breath sounds: Normal breath sounds. No wheezing or rales.  Abdominal:     General: Bowel sounds are normal. There is no distension.     Palpations: Abdomen is soft. Abdomen is not rigid. There is no mass.     Tenderness: There is no abdominal tenderness. There is no right CVA tenderness, left CVA tenderness, guarding or rebound.     Hernia: No hernia is present.     Comments: No tenderness to palpation of abdomen.   Musculoskeletal: Normal range of motion.  Skin:    Findings: No rash.  Neurological:     Mental Status: She is alert and oriented to person, place, and time.    ED Treatments / Results  Labs (all labs ordered are listed, but only abnormal results are displayed) Labs Reviewed  COMPREHENSIVE METABOLIC PANEL - Abnormal; Notable for the following components:      Result Value   Total Protein 8.3 (*)    All other components within normal limits  CBC - Abnormal; Notable for the following components:   WBC 15.9 (*)    All other components within normal limits  URINALYSIS, ROUTINE W REFLEX MICROSCOPIC - Abnormal; Notable for the following components:   Hgb urine dipstick SMALL (*)    Bacteria, UA FEW (*)    All other  components within normal limits  URINE CULTURE  LIPASE, BLOOD  POC URINE PREG, ED    EKG None  Radiology No results found.  Procedures Procedures (including critical care time)  Medications Ordered in ED Medications  sodium chloride 0.9 % bolus 1,000 mL (1,000 mLs Intravenous New Bag/Given 09/14/18 0000)     Initial Impression / Assessment and Plan / ED Course  I have reviewed the triage vital signs and the nursing notes.  Pertinent labs & imaging results that were available during my care of the patient were  reviewed by me and considered in my medical decision making (see chart for details).  Clinical Course as of Sep 13 48  Wed Sep 14, 2018  0017 Leukocytosis noted at 15.9.  WBC(!): 15.9 [AH]  0023 UA reveals small Hgb, bacteria, and WBCs. Patient denies urinary symptoms at this time. Ordered urine culture.  Urinalysis, Routine w reflex microscopic(!) [AH]  0030 CMP reveals high total protein at 8.3, but otherwise unremarkable.   Comprehensive metabolic panel(!) [AH]  2671 Reassessed patient. Patient states symptoms have completely resolved. Patient denies any nausea at this time. Patient continues to deny abdominal pain. Patient states she is comfortable with being discharged.    [AH]    Clinical Course User Index [AH] Arville Lime, PA-C      Patient presents with nausea, vomiting, and diarrhea. Patient is nontoxic, nonseptic appearing, in no apparent distress.  Patient's pain and other symptoms adequately managed in emergency department.  Fluid bolus given. Labs and vitals reviewed.  Leukocytosis noted at 15.9. Patient is afebrile and vitals are within normal limits. Patient does not meet the SIRS or Sepsis criteria.  On exam patient has no abdominal pain.  No indication of appendicitis, bowel obstruction, bowel perforation, cholecystitis, diverticulitis, or ectopic pregnancy. Patient has not required antiemetics in the ER. Patient states symptoms have completely  resolved. Patient discharged home with symptomatic treatment and given strict instructions for follow-up with their primary care physician.  I have also discussed reasons to return immediately to the ER.  Patient expresses understanding and agrees with plan.  Final Clinical Impressions(s) / ED Diagnoses   Final diagnoses:  Nausea vomiting and diarrhea    ED Discharge Orders         Ordered    ondansetron (ZOFRAN) 4 MG tablet  Every 8 hours PRN     09/14/18 0047           Arville Lime, PA-C 09/14/18 Minkler, Ankit, MD 09/20/18 [phone]

## 2018-09-14 LAB — COMPREHENSIVE METABOLIC PANEL
ALT: 18 U/L (ref 0–44)
AST: 21 U/L (ref 15–41)
Albumin: 4.7 g/dL (ref 3.5–5.0)
Alkaline Phosphatase: 80 U/L (ref 38–126)
Anion gap: 10 (ref 5–15)
BUN: 13 mg/dL (ref 6–20)
CO2: 23 mmol/L (ref 22–32)
Calcium: 9.4 mg/dL (ref 8.9–10.3)
Chloride: 108 mmol/L (ref 98–111)
Creatinine, Ser: 0.72 mg/dL (ref 0.44–1.00)
GFR calc Af Amer: >60 mL/min (ref >60)
GFR calc non Af Amer: >60 mL/min (ref >60)
Glucose, Bld: 97 mg/dL (ref 70–99)
Potassium: 4.2 mmol/L (ref 3.5–5.1)
Sodium: 141 mmol/L (ref 135–145)
Total Bilirubin: 0.5 mg/dL (ref 0.3–1.2)
Total Protein: 8.3 g/dL — ABNORMAL HIGH (ref 6.5–8.1)

## 2018-09-14 LAB — LIPASE, BLOOD: Lipase: 22 U/L (ref 11–51)

## 2018-09-14 MED ORDER — ONDANSETRON HCL 4 MG PO TABS: 4.0000 mg | ORAL_TABLET | Freq: Three times a day (TID) | ORAL | 0 refills | Status: AC | PRN

## 2018-09-14 NOTE — Discharge Instructions (Addendum)
You have been seen today for nausea, vomiting, and diarrhea. Please read and follow all provided instructions.   1. Medications: zofran for nausea/vomiting, usual home medications 2. Treatment: rest, drink plenty of fluids, advance diet as tolerated  3. Follow Up: Please follow up with your primary doctor in 2 days for discussion of your diagnoses and further evaluation after today's visit; if you do not have a primary care doctor use the resource guide provided to find one; Please return to the ER for any new or worsening symptoms. Please obtain all of your results from medical records or have your doctors office obtain the results - share them with your doctor - you should be seen at your doctors office. Call today to arrange your follow up.   Take medications as prescribed. Please review all of the medicines and only take them if you do not have an allergy to them. Return to the emergency room for worsening condition or new concerning symptoms. Follow up with your regular doctor. If you don't have a regular doctor use one of the numbers below to establish a primary care doctor.  Please be aware that if you are taking birth control pills, taking other prescriptions, ESPECIALLY ANTIBIOTICS may make the birth control ineffective - if this is the case, either do not engage in sexual activity or use alternative methods of birth control such as condoms until you have finished the medicine and your family doctor says it is OK to restart them. If you are on a blood thinner such as COUMADIN, be aware that any other medicine that you take may cause the coumadin to either work too much, or not enough - you should have your coumadin level rechecked in next 7 days if this is the case.  ?  It is also a possibility that you have an allergic reaction to any of the medicines that you have been prescribed - Everybody reacts differently to medications and while MOST people have no trouble with most medicines, you may have  a reaction such as nausea, vomiting, rash, swelling, shortness of breath. If this is the case, please stop taking the medicine immediately and contact your physician.  ?  You should return to the ER if you develop severe or worsening symptoms.   Emergency Department Resource Guide 1) Find a Doctor and Pay Out of Pocket Although you won't have to find out who is covered by your insurance plan, it is a good idea to ask around and get recommendations. You will then need to call the office and see if the doctor you have chosen will accept you as a new patient and what types of options they offer for patients who are self-pay. Some doctors offer discounts or will set up payment plans for their patients who do not have insurance, but you will need to ask so you aren't surprised when you get to your appointment.  2) Contact Your Local Health Department Not all health departments have doctors that can see patients for sick visits, but many do, so it is worth a call to see if yours does. If you don't know where your local health department is, you can check in your phone book. The CDC also has a tool to help you locate your state's health department, and many state websites also have listings of all of their local health departments.  3) Find a [facility] If your illness is not likely to be very severe or complicated, you may want to try  a walk in clinic. These are popping up all over the country in pharmacies, drugstores, and shopping centers. They're usually staffed by nurse practitioners or physician assistants that have been trained to treat common illnesses and complaints. They're usually fairly quick and inexpensive. However, if you have serious medical issues or chronic medical problems, these are probably not your best option.  No Primary Care Doctor: Call Health Connect at  [phone] - they can help you locate a primary care doctor that  accepts your insurance, provides certain services,  etc. Physician Referral Service- [phone]  Chronic Pain Problems: Organization         Address  Phone   Notes  [facility]  [phone] Patients need to be referred by their primary care doctor.   Medication Assistance: Organization         Address  Phone   Notes  [facility] Medication [facility] [address] [place], [place] [address] [phone] --Must be a resident of [facility] -- Must have NO insurance coverage whatsoever (no Medicaid/ Medicare, etc.) -- The pt. MUST have a primary care doctor that directs their care regularly and follows them in the community   [facility]  [phone]   [employer]  [phone]    Agencies that provide inexpensive medical care: Organization         Address  Phone   Notes  [place]  [phone]   [facility]    [phone]   [facility] [place], [place] [address] [phone]   [place] [address], [place] [phone]   [facility]    [phone]   [facility]    [phone]   [place] [address], [place] Phone:  [phone], Fax:  [phone] Hours of Operation:  9 am - 6 pm, M-F.  Also accepts Medicaid/Medicare and self-pay.  [facility] for [place], [address], [place] Phone: [phone], Fax: [phone]. Hours of Operation:  8:30 am - 5:30 pm, M-F.  Also accepts Medicaid and self-pay.  [facility] [place] [address], [place] Phone: [phone]   [place], [place], [place] [phone], Ext. [phone] Mondays & Thursdays: 7-9 AM.  First 15 patients are seen on a first come, first serve basis.    [place] Providers:  Organization         Address  Phone   Notes  [facility] [address], [place] [phone] Also accepts self-pay patients.  [facility] [address], [place]  [phone]   [place], [address], [place] [phone]   [facility] [address], [place] [phone]   Lucianne Lei [address], [place]   [phone] Only accepts [place] [facility] [place] patients after they have their name applied to their card.   Self-Pay (no insurance) in [facility]:  Organization         Address  Phone   Notes  Sickle Cell Patients, [facility] [place] [phone]   [facility] [place] [phone]   [facility] [place]  [place], [address], [place] [phone]   [facility]/Dr. Osei-Bonsu  [address] [place] or [address], [place] [phone] Phone number for both [place] and [place] locations is the same.  Urgent Medical and [facility] [address], [place] [phone]   [facility] [address] [place] or [address] [phone]   [facility] [address] [place] [phone], phone; [phone], fax Sees patients 1st and 3rd Saturday of every month.  Must not qualify for public or private insurance (i.e. Medicaid, Medicare, [place] Health Choice, Veterans' Benefits)  Household income should be no more than 200% of the poverty level The clinic cannot treat you if you are pregnant or think you are pregnant  Sexually transmitted diseases are not treated at the clinic.

## 2018-09-16 LAB — URINE CULTURE: Culture: >=100000 — AB

## 2018-09-17 NOTE — Progress Notes (Signed)
ED Antimicrobial Stewardship Positive Culture Follow Up   Denise Perry is an 33 y.o. female who presented to [facility] on 09/13/2018 with a chief complaint of  Chief Complaint  Patient presents with  . Emesis    Recent Results (from the past 720 hour(s))  Urine Culture     Status: Abnormal   Collection Time: 09/13/18 11:27 PM   Specimen: Urine, Random  Result Value Ref Range Status   Specimen Description   Final    URINE, RANDOM Performed at [place] [address] [place], [place] [address]    Special Requests   Final    NONE Performed at [facility] [place] [address] [place], [place] [address]    Culture >=100,000 COLONIES/mL ESCHERICHIA COLI (A)  Final   Report Status 09/16/2018 FINAL  Final   Organism ID, Bacteria ESCHERICHIA COLI (A)  Final      Susceptibility   Escherichia coli - MIC*    AMPICILLIN 4 SENSITIVE Sensitive     CEFAZOLIN <=4 SENSITIVE Sensitive     CEFTRIAXONE <=1 SENSITIVE Sensitive     CIPROFLOXACIN >=4 RESISTANT Resistant     GENTAMICIN <=1 SENSITIVE Sensitive     IMIPENEM <=0.25 SENSITIVE Sensitive     NITROFURANTOIN <=16 SENSITIVE Sensitive     TRIMETH/SULFA <=20 SENSITIVE Sensitive     AMPICILLIN/SULBACTAM <=2 SENSITIVE Sensitive     PIP/TAZO <=4 SENSITIVE Sensitive     Extended ESBL NEGATIVE Sensitive     * >=100,000 COLONIES/mL ESCHERICHIA COLI   l [x]  Patient discharged originally without antimicrobial agent and treatment may now be indicated  Patient presented with N/V/D. Determined not to be infectious process and not discharged on antibiotics.   Plan:  -Call patient and assess for any symptoms of UTI (fever, chills, abdominal pain, flank pain, dysuria, increased urinary frequency/urgency). -If asymptomatic, no action required -If symptomatic, prescribe cephalexin  New antibiotic prescription: cephalexin 500 mg PO BID x 7 days. No refills.  ED Provider: Benedetto Goad, PA-C   Lenis Noon, PharmD 09/17/2018, 12:20 PM Clinical Pharmacist [phone]

## 2018-09-18 ENCOUNTER — Telehealth: Payer: Self-pay | Admitting: Emergency Medicine

## 2018-09-18 NOTE — Telephone Encounter (Signed)
Post ED Visit - Positive Culture Follow-up: Successful Patient Follow-Up  Culture assessed and recommendations reviewed by:  []  Elenor Quinones, Pharm.D. []  Heide Guile, Pharm.D., BCPS AQ-ID []  Parks Neptune, Pharm.D., BCPS []  Alycia Rossetti, Pharm.D., BCPS []  [place], Pharm.D., BCPS, AAHIVP []  Legrand Como, Pharm.D., BCPS, AAHIVP []  Salome Arnt, PharmD, BCPS []  Johnnette Gourd, PharmD, BCPS []  Hughes Better, PharmD, BCPS [x]  Samul Dada, PharmD  Positive urine culture  [x]  Patient discharged without antimicrobial prescription and treatment is now indicated []  Organism is resistant to prescribed ED discharge antimicrobial []  Patient with positive blood cultures  Changes discussed with ED provider: Benedetto Goad, PA New antibiotic prescription if symptomatic: Keflex 500mg  PO BID x seven days   09/17/2018: left voicemail to call office back with female that identified themselves as patient's sister.  07/05/220: left voicemail. Letter sent to address on file.   Sandi Raveling Devita Nies 09/18/2018, 5:48 PM

## 2018-10-27 ENCOUNTER — Encounter (HOSPITAL_COMMUNITY): Payer: Self-pay | Admitting: Emergency Medicine

## 2018-10-27 ENCOUNTER — Other Ambulatory Visit: Payer: Self-pay

## 2018-10-27 DIAGNOSIS — R1110 Vomiting, unspecified: Secondary | ICD-10-CM | POA: Insufficient documentation

## 2018-10-27 DIAGNOSIS — Z5321 Procedure and treatment not carried out due to patient leaving prior to being seen by health care provider: Secondary | ICD-10-CM | POA: Insufficient documentation

## 2018-10-27 DIAGNOSIS — F10920 Alcohol use, unspecified with intoxication, uncomplicated: Secondary | ICD-10-CM | POA: Insufficient documentation

## 2018-10-27 MED ORDER — SODIUM CHLORIDE 0.9% FLUSH: 3.0000 mL | Freq: Once | INTRAVENOUS | Status: DC

## 2018-10-27 NOTE — ED Triage Notes (Signed)
Patient here from home with complaints of ETOH and nausea, vomiting. Reports "I had too much to drink". Also reports anxiety that started today.

## 2018-10-28 ENCOUNTER — Emergency Department (HOSPITAL_COMMUNITY)
Admission: EM | Admit: 2018-10-28 | Discharge: 2018-10-28 | Disposition: A | Discharge status: Home / Self Care | Payer: Self-pay | Attending: Emergency Medicine | Admitting: Emergency Medicine

## 2018-10-28 LAB — CBC
HCT: 42.4 % (ref 36.0–46.0)
Hemoglobin: 13.8 g/dL (ref 12.0–15.0)
MCH: 30.9 pg (ref 26.0–34.0)
MCHC: 32.5 g/dL (ref 30.0–36.0)
MCV: 94.9 fL (ref 80.0–100.0)
Platelets: 240 10*3/uL (ref 150–400)
RBC: 4.47 MIL/uL (ref 3.87–5.11)
RDW: 13.3 % (ref 11.5–15.5)
WBC: 11 10*3/uL — ABNORMAL HIGH (ref 4.0–10.5)
nRBC: 0 % (ref 0.0–0.2)

## 2018-10-28 LAB — LIPASE, BLOOD: Lipase: 19 U/L (ref 11–51)

## 2018-10-28 LAB — COMPREHENSIVE METABOLIC PANEL
ALT: 14 U/L (ref 0–44)
AST: 18 U/L (ref 15–41)
Albumin: 4.6 g/dL (ref 3.5–5.0)
Alkaline Phosphatase: 80 U/L (ref 38–126)
Anion gap: 10 (ref 5–15)
BUN: 10 mg/dL (ref 6–20)
CO2: 23 mmol/L (ref 22–32)
Calcium: 9.2 mg/dL (ref 8.9–10.3)
Chloride: 105 mmol/L (ref 98–111)
Creatinine, Ser: 0.72 mg/dL (ref 0.44–1.00)
GFR calc Af Amer: >60 mL/min (ref >60)
GFR calc non Af Amer: >60 mL/min (ref >60)
Glucose, Bld: 94 mg/dL (ref 70–99)
Potassium: 3.5 mmol/L (ref 3.5–5.1)
Sodium: 138 mmol/L (ref 135–145)
Total Bilirubin: 0.9 mg/dL (ref 0.3–1.2)
Total Protein: 8 g/dL (ref 6.5–8.1)

## 2018-10-28 LAB — I-STAT BETA HCG BLOOD, ED (MC, WL, AP ONLY): I-stat hCG, quantitative: <5 m[IU]/mL (ref <5)

## 2018-10-28 NOTE — ED Notes (Signed)
Called for room no answer

## 2019-01-27 IMAGING — CR DG CHEST 2V
2 series · 2 of 2 positions shown · non-contrast
Comparison: 05/11/2016

CLINICAL DATA: Chest pain

EXAM:
CHEST  2 VIEW

[w chest pa]
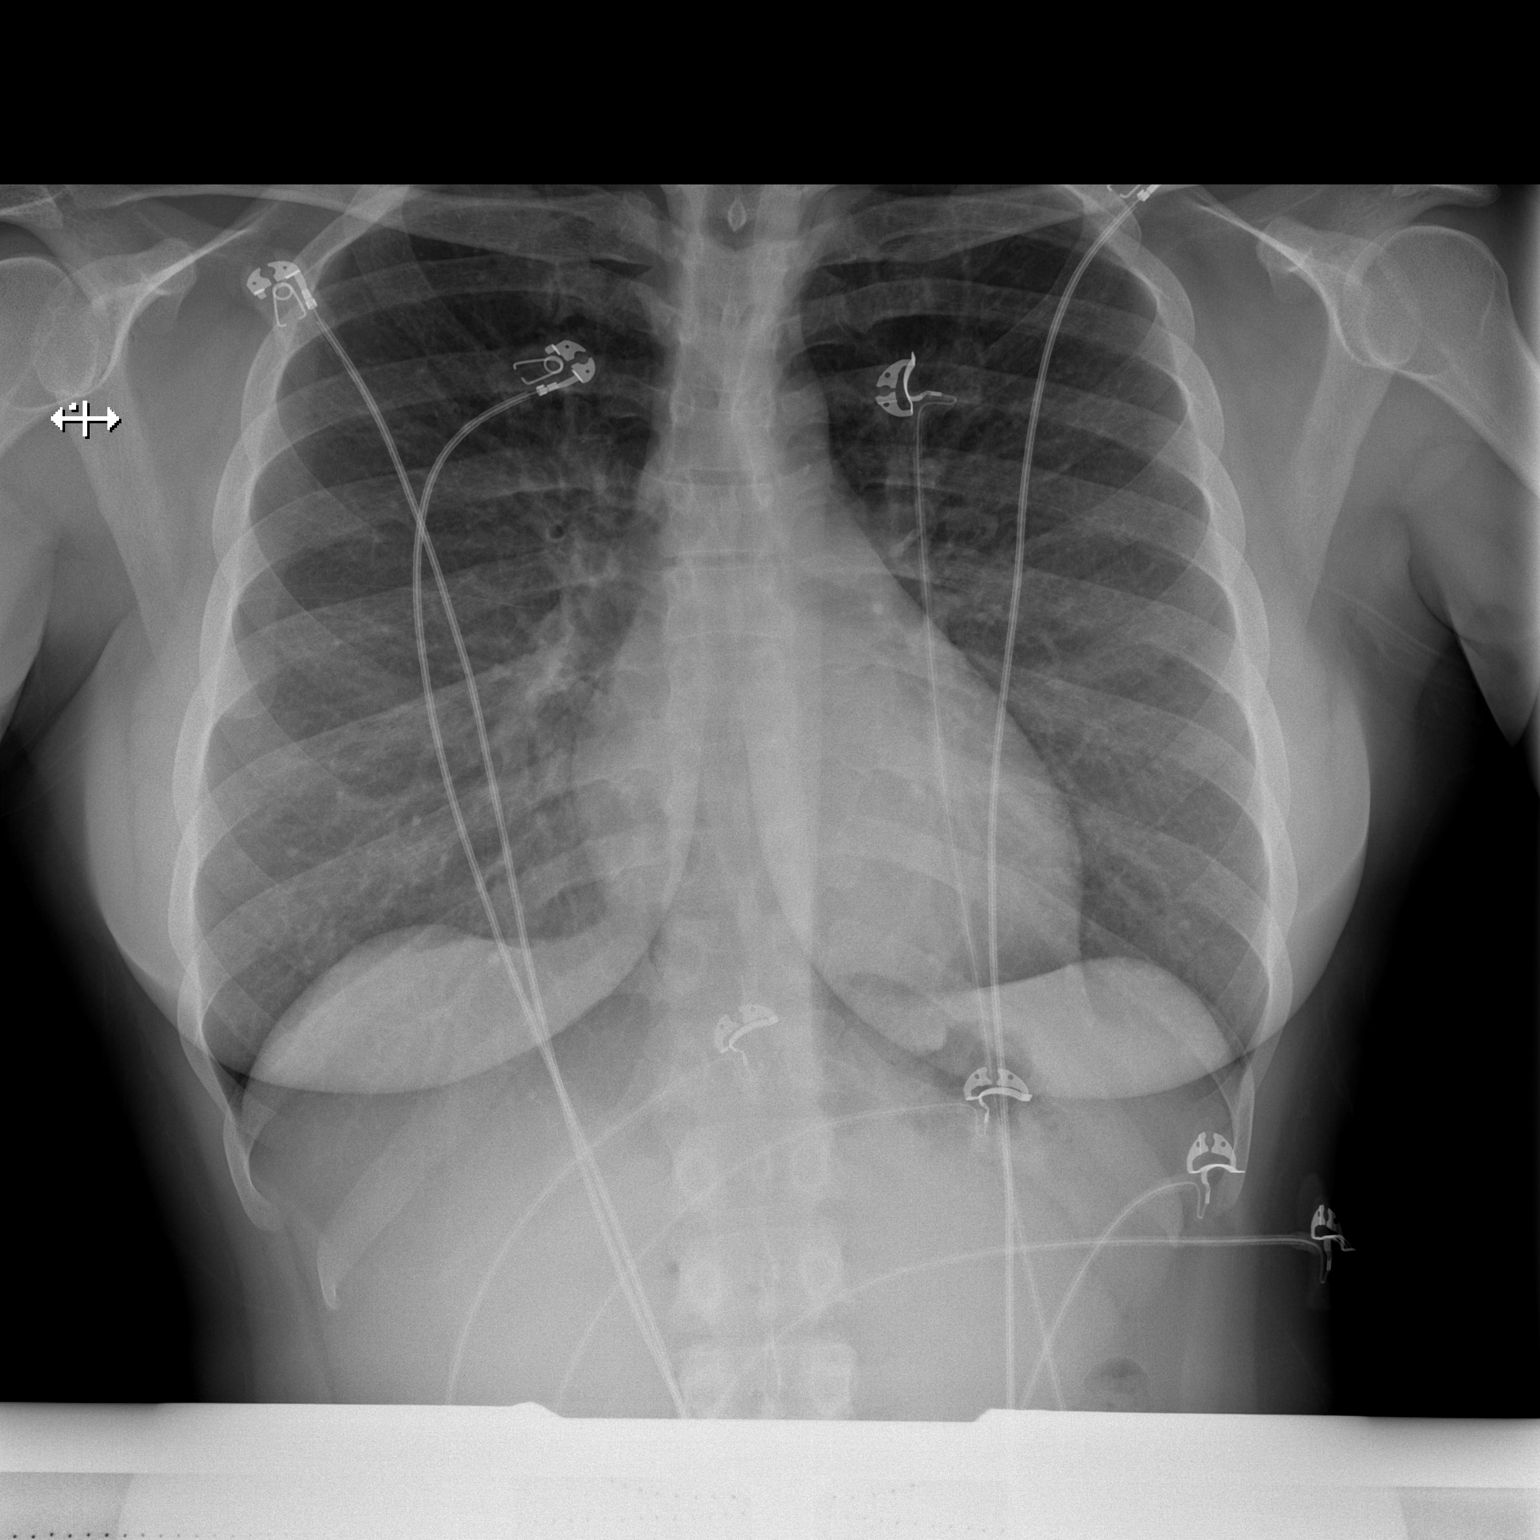

[w chest lat]
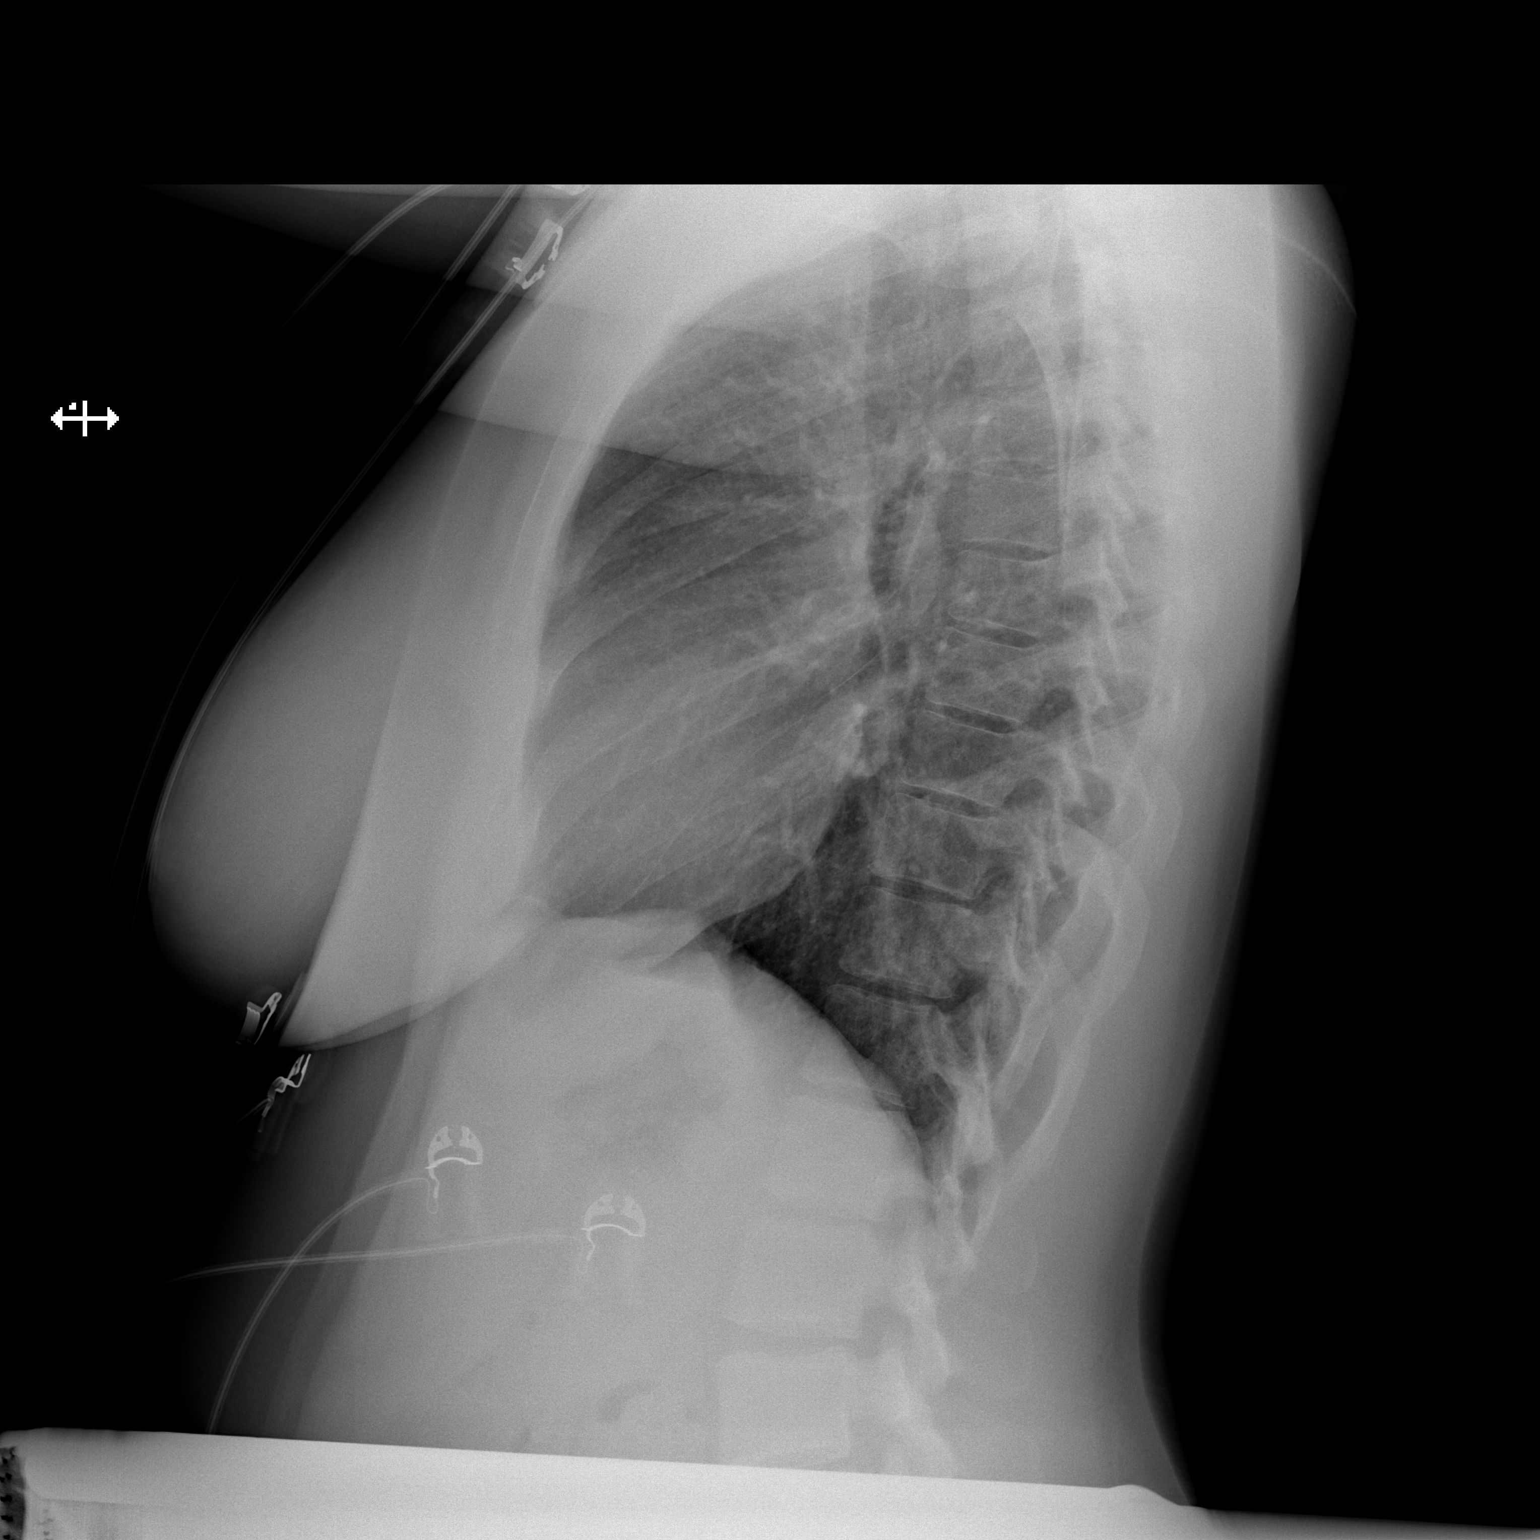

[2 of 2 positions shown; findings below may reference images not displayed]

FINDINGS: The heart size and mediastinal contours are within normal limits.
Both lungs are clear. The visualized skeletal structures are
unremarkable.
IMPRESSION: No active cardiopulmonary disease.

## 2019-02-05 IMAGING — DX DG CHEST 2V
2 series · 2 of 2 positions shown · non-contrast
Comparison: Radiograph 11/18/2016

CLINICAL DATA: Chest pain.  Cough.

EXAM:
CHEST  2 VIEW

[chest pa]
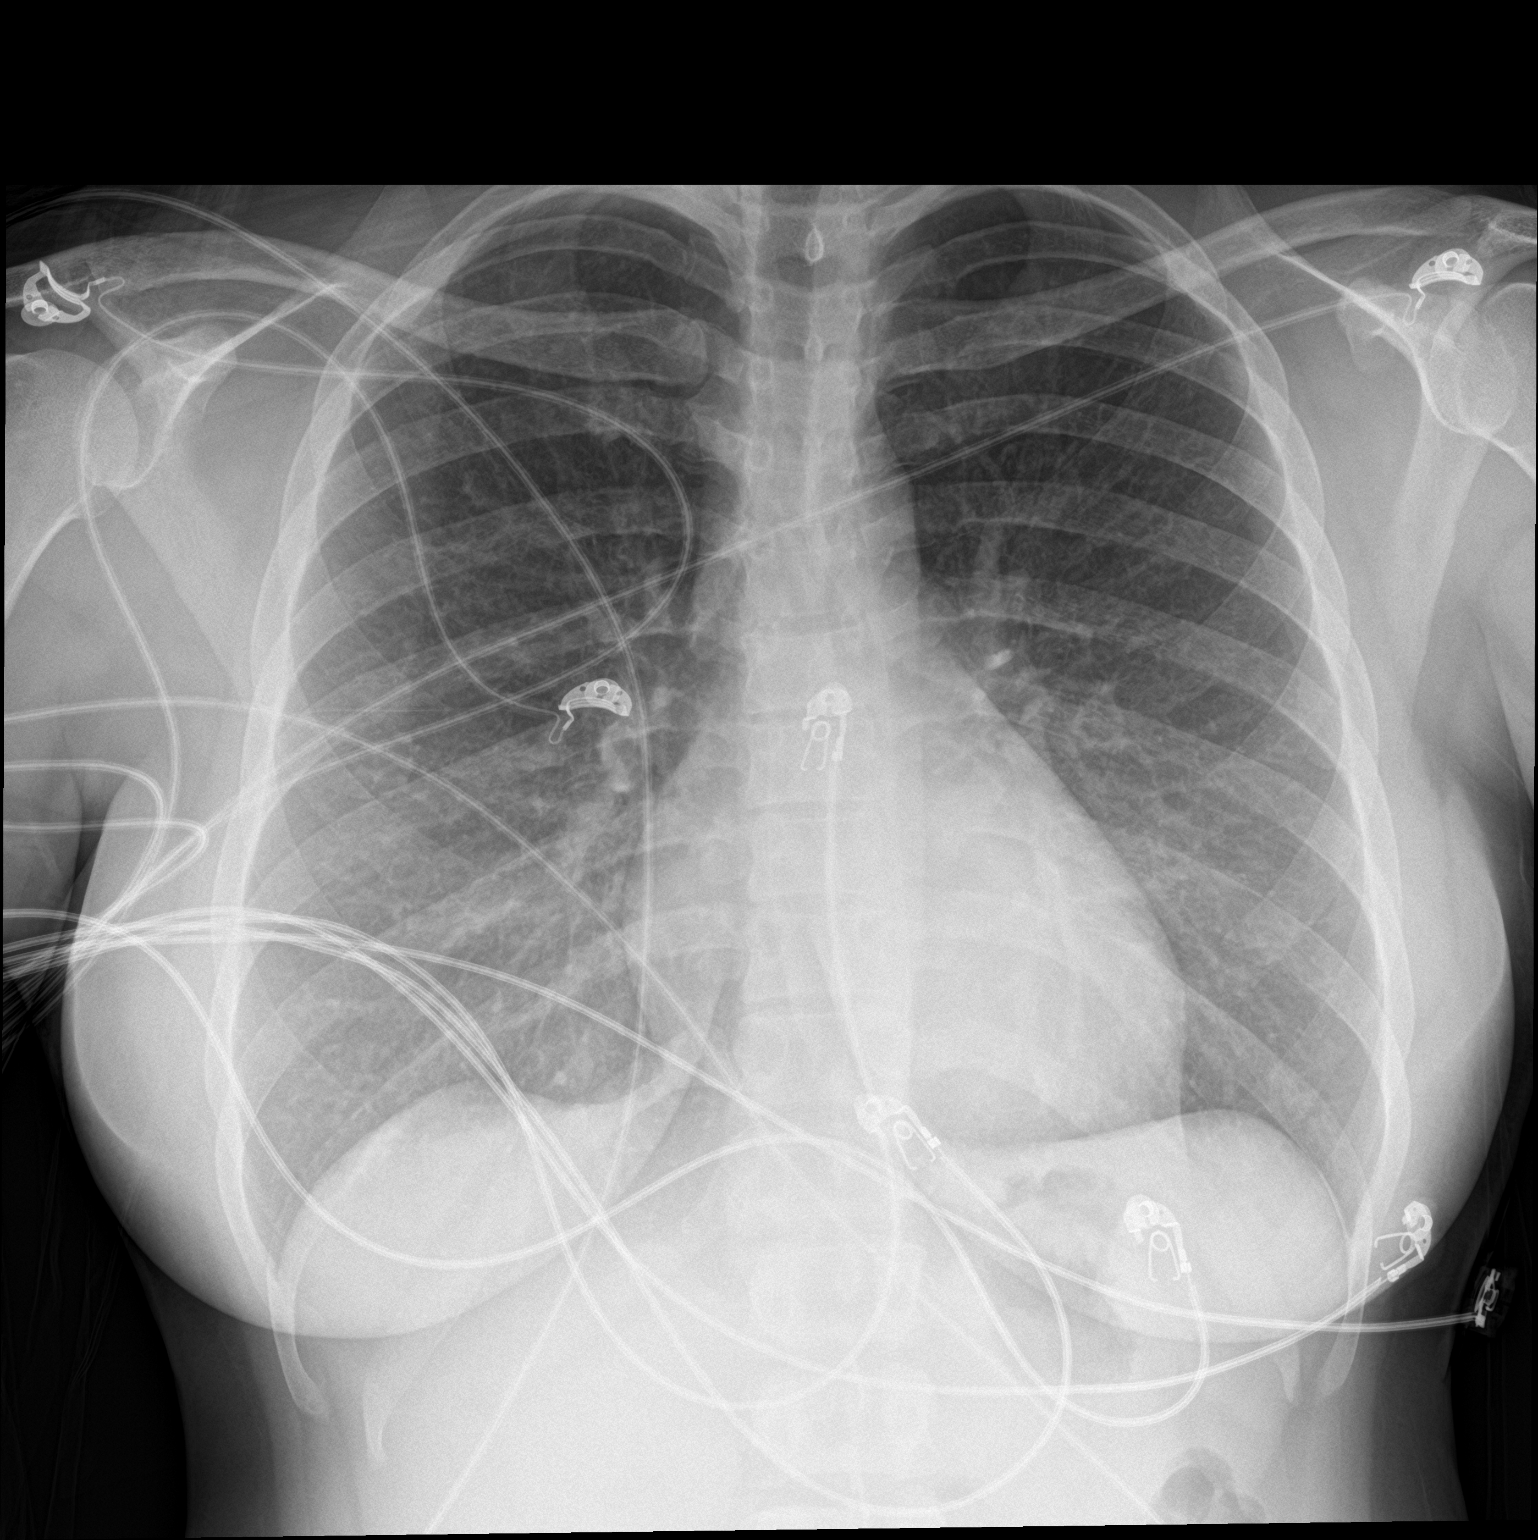

[chest lat]
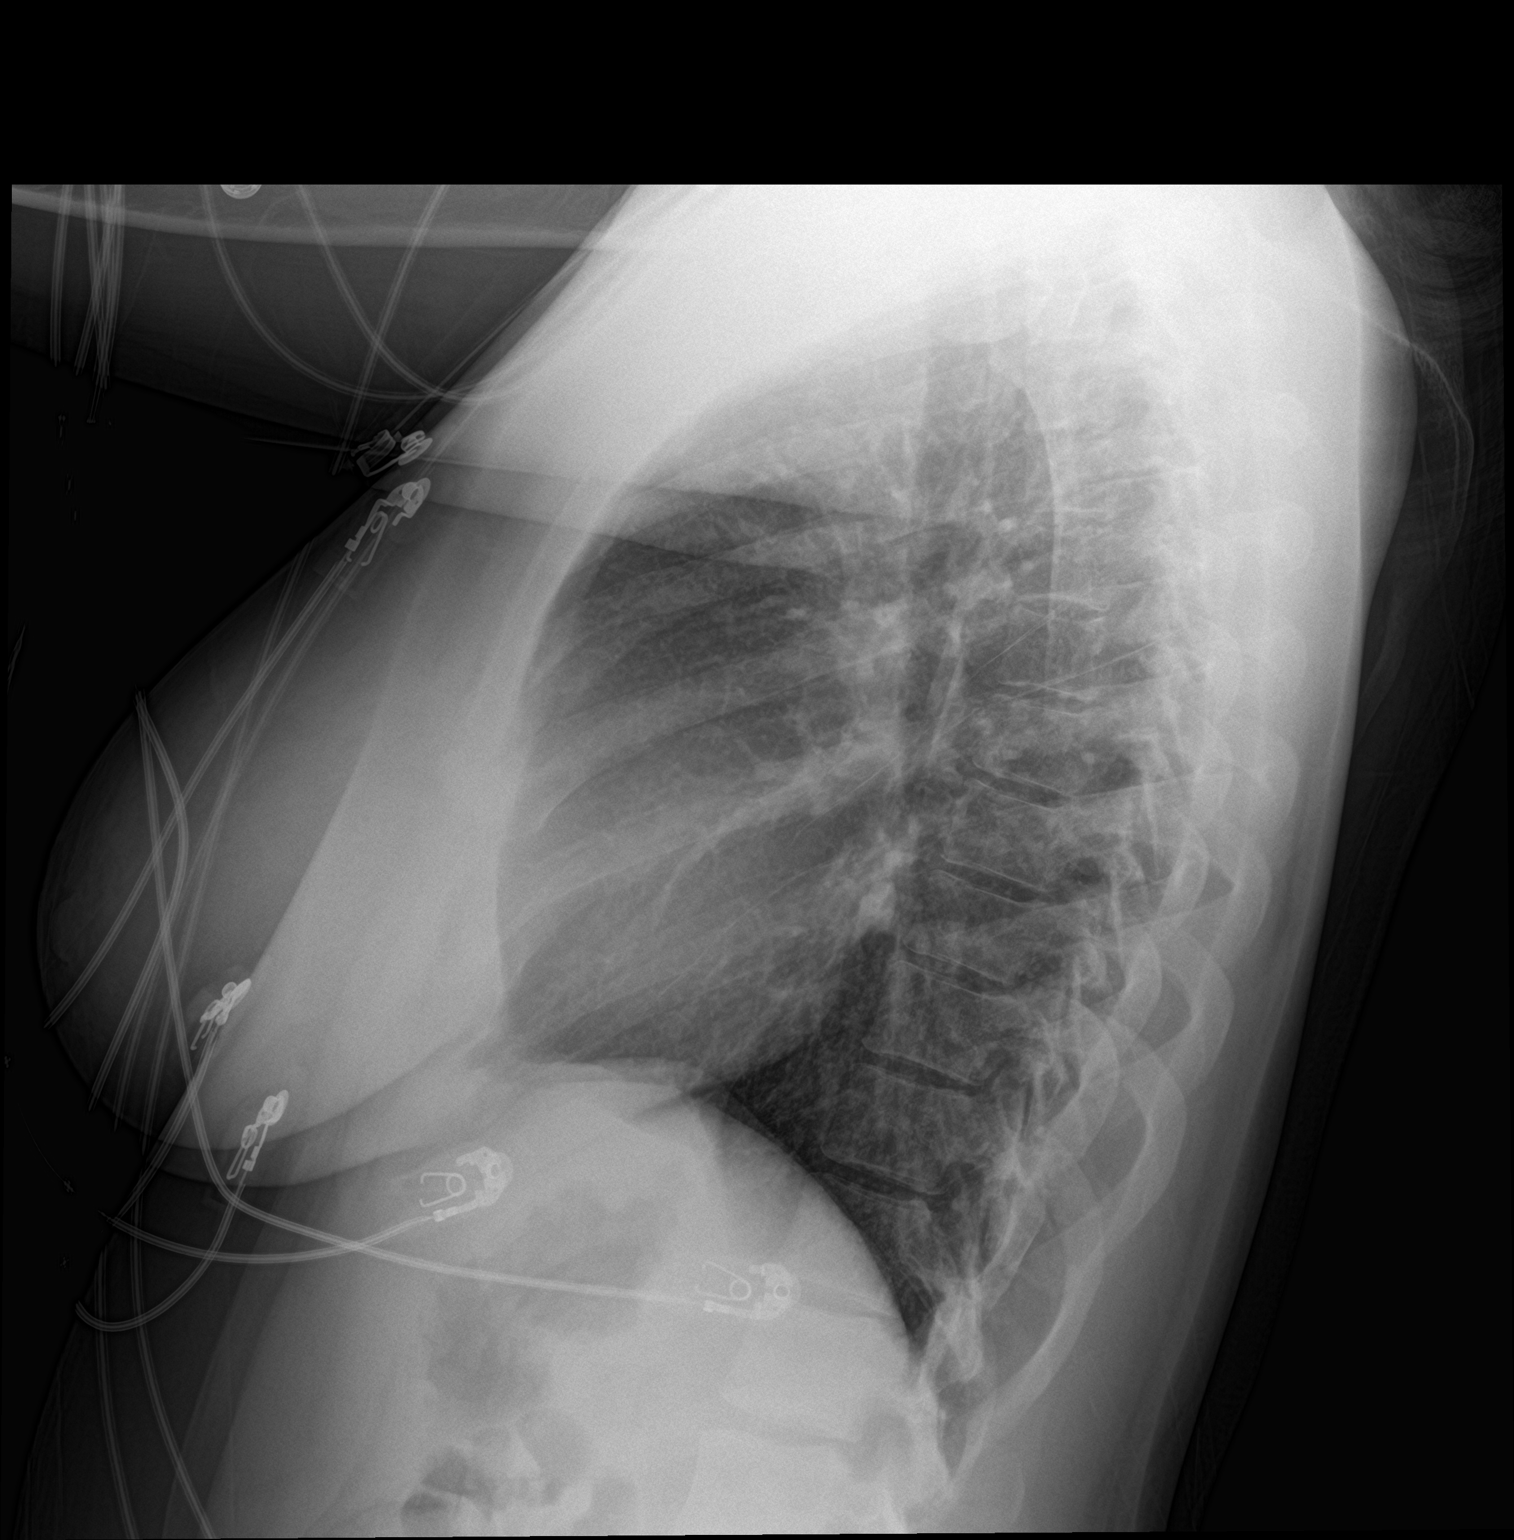

[2 of 2 positions shown; findings below may reference images not displayed]

FINDINGS: The cardiomediastinal contours are normal. The lungs are clear.
Pulmonary vasculature is normal. No consolidation, pleural effusion,
or pneumothorax. No acute osseous abnormalities are seen.
IMPRESSION: Unremarkable radiographs of the chest.

## 2019-02-25 ENCOUNTER — Other Ambulatory Visit: Payer: Self-pay

## 2019-02-25 ENCOUNTER — Emergency Department (HOSPITAL_COMMUNITY): Payer: Self-pay

## 2019-02-25 ENCOUNTER — Encounter (HOSPITAL_COMMUNITY): Payer: Self-pay | Admitting: Emergency Medicine

## 2019-02-25 ENCOUNTER — Emergency Department (HOSPITAL_COMMUNITY)
Admission: EM | Admit: 2019-02-25 | Discharge: 2019-02-25 | Disposition: A | Discharge status: Home / Self Care | Payer: Self-pay | Attending: Emergency Medicine | Admitting: Emergency Medicine

## 2019-02-25 DIAGNOSIS — S63639A Sprain of interphalangeal joint of unspecified finger, initial encounter: Secondary | ICD-10-CM

## 2019-02-25 DIAGNOSIS — I10 Essential (primary) hypertension: Secondary | ICD-10-CM | POA: Insufficient documentation

## 2019-02-25 DIAGNOSIS — Y9371 Activity, boxing: Secondary | ICD-10-CM | POA: Insufficient documentation

## 2019-02-25 DIAGNOSIS — F17210 Nicotine dependence, cigarettes, uncomplicated: Secondary | ICD-10-CM | POA: Insufficient documentation

## 2019-02-25 DIAGNOSIS — Y929 Unspecified place or not applicable: Secondary | ICD-10-CM | POA: Insufficient documentation

## 2019-02-25 DIAGNOSIS — S63634A Sprain of interphalangeal joint of right ring finger, initial encounter: Secondary | ICD-10-CM | POA: Insufficient documentation

## 2019-02-25 DIAGNOSIS — Y999 Unspecified external cause status: Secondary | ICD-10-CM | POA: Insufficient documentation

## 2019-02-25 DIAGNOSIS — W2209XA Striking against other stationary object, initial encounter: Secondary | ICD-10-CM | POA: Insufficient documentation

## 2019-02-25 NOTE — ED Provider Notes (Signed)
[facility] EMERGENCY [facility] Provider Note   CSN: [id number] Arrival date & time: 02/25/19  1621     History Chief Complaint  Patient presents with  . finger injury    Denise Perry is a 33 y.o. female who presents to the ED for R 5th digit pain and deformity that occurred about 1 week ago when she punched something while boxing. She said that finger was already injured so she was holding it down with her ring finger inside her boxing glove. She reports hearing a popping noise in her hand when she made impact and had immediate swelling. She states she has not been able to bend her finger since the incident but the swelling has gotten better. She reports worsening pain when trying to move it or if something hits it. Denies wrist pain, elbow pain or any other injuries at this time. No other medical concerns at this time.    Past Medical History:  Diagnosis Date  . Anxiety   . Functional ovarian cysts   . Hypertension   . Migraines     Patient Active Problem List   Diagnosis Date Noted  . Biological false positive RPR test 04/29/2017  . PID (acute pelvic inflammatory disease) 04/27/2017    History reviewed. No pertinent surgical history.   OB History    Gravida  2   Para  2   Term  2   Preterm      AB      Living  2     SAB      TAB      Ectopic      Multiple      Live Births  2        Obstetric Comments  SVD x 2        No family history on file.  Social History   Tobacco Use  . Smoking status: Current Every Day Smoker    Packs/day: 1.00  . Smokeless tobacco: Never Used  Substance Use Topics  . Alcohol use: Yes    Comment: socially   . Drug use: No    Home Medications Prior to Admission medications   Medication Sig Start Date End Date Taking? Authorizing Provider  ibuprofen (ADVIL,MOTRIN) 200 MG tablet Take 400 mg by mouth every 6 (six) hours as needed for mild pain.    [provider]  ondansetron  (ZOFRAN) 4 MG tablet Take 1 tablet (4 mg total) by mouth every 8 (eight) hours as needed for nausea or vomiting. 09/14/18   Carlyle BasquesHernandez, Ana P, PA-C  ranitidine (ZANTAC) 150 MG tablet Take 1 tablet (150 mg total) by mouth 2 (two) times daily. Patient not taking: Reported on 09/25/2017 07/31/17   Muthersbaugh, Dahlia ClientHannah, PA-C    Allergies    Patient has no known allergies.  Review of Systems   Review of Systems  Constitutional: Negative for chills and fever.  Respiratory: Negative for cough and shortness of breath.   Cardiovascular: Negative for chest pain.  Gastrointestinal: Negative for diarrhea and vomiting.  Musculoskeletal: Positive for arthralgias (R 5th digit pain) and joint swelling. Negative for gait problem and neck stiffness.  Skin: Negative for rash and wound.  Neurological: Negative for dizziness, syncope and weakness.  Hematological: Does not bruise/bleed easily.  All other systems reviewed and are negative.   Physical Exam Updated Vital Signs BP 125/81 (BP Location: Right Arm)   Pulse 81   Temp 98.4 F (36.9 C) (Oral)   Resp 14  Ht 5\' 3"  (1.6 m)   LMP 02/12/2019   SpO2 99%   BMI 24.80 kg/m   Physical Exam Vitals and nursing note reviewed.  Constitutional:      General: She is not in acute distress.    Appearance: She is well-developed.  HENT:     Head: Normocephalic and atraumatic.     Nose: Nose normal.     Mouth/Throat:     Comments: masked Eyes:     General:        Right eye: No discharge.        Left eye: No discharge.     Conjunctiva/sclera: Conjunctivae normal.  Cardiovascular:     Rate and Rhythm: Normal rate.     Pulses: Normal pulses.  Pulmonary:     Effort: Pulmonary effort is normal. No respiratory distress.  Abdominal:     General: There is no distension.  Musculoskeletal:     Right hand: Normal.     Left hand: Swelling present. Normal pulse.     Cervical back: Normal range of motion and neck supple.     Comments: R 5th digit:  hyperextension at the PIP with swelling, bruising of the finger. Cap refill <2 seconds at pad.   Skin:    General: Skin is warm.     Capillary Refill: Capillary refill takes less than 2 seconds.     Findings: No rash.  Neurological:     Mental Status: She is alert and oriented to person, place, and time. Mental status is at baseline.     Gait: Gait normal.     ED Results / Procedures / Treatments   Labs (all labs ordered are listed, but only abnormal results are displayed) Labs Reviewed - No data to display  EKG None  Radiology DG Hand Complete Right  Result Date: 02/25/2019 CLINICAL DATA:  Right fifth finger injury last week. EXAM: RIGHT HAND - COMPLETE 3+ VIEW COMPARISON:  None. FINDINGS: There is no evidence of fracture or dislocation. There is no evidence of arthropathy or other focal bone abnormality. Soft tissues are unremarkable. IMPRESSION: Negative. Electronically Signed   By: 14/02/2019 M.D.   On: 02/25/2019 17:21    Procedures Procedures (including critical care time)  Medications Ordered in ED Medications - No data to display  ED Course  I have reviewed the triage vital signs and the nursing notes.  Pertinent labs & imaging results that were available during my care of the patient were reviewed by me and considered in my medical decision making (see chart for details).  Clinical Course as of Feb 25 1836  Sat Feb 25, 2019  1800 Case discussed with radiologist who confirms no fracture or dislocation.    [SI]    Clinical Course User Index [SI] Feb 27, 2019    33 y.o. female with right 5th finger injury sustained 1 week ago while boxing, suspect volar plate injury of PIP joint. XR reviewed and negative for fracture or dislocation. Discussed with radiologist who confirmed hyperextension without dislocation or avulsion fracture. Finger splint placed in the ED by ortho tech. Will discharge with recommendation and referral for hand surgery follow-up. Tylenol or  Motrin as needed for pain. Return precautions provided. Patient is agreeable with plan.   Final Clinical Impression(s) / ED Diagnoses Final diagnoses:  Volar plate injury of interphalangeal finger joint, initial encounter    Rx / DC Orders ED Discharge Orders    None     Scribe's Attestation: 31, MD  obtained and performed the history, physical exam and medical decision making elements that were entered into the chart. Documentation assistance was provided by me personally, a scribe. Signed by Cristal Generous, Scribe on 02/25/2019 5:43 PM ? Documentation assistance provided by the scribe. I was present during the time the encounter was recorded. The information recorded by the scribe was done at my direction and has been reviewed and validated by me. Rosalva Ferron, MD 02/25/2019 5:43 PM     Willadean Carol, MD 02/25/19 [phone]

## 2019-02-25 NOTE — ED Triage Notes (Signed)
Pt states she injured R 5th digit when boxing 1 week ago.  C/o pain and deformity.

## 2019-02-25 NOTE — Progress Notes (Signed)
Orthopedic Tech Progress Note Patient Details:  Denise Perry Oct 26, 1985 [id number]  Ortho Devices Type of Ortho Device: Finger splint Ortho Device/Splint Location: right Ortho Device/Splint Interventions: Application   Post Interventions Patient Tolerated: Well Instructions Provided: Care of device   [place] Pink 02/25/2019, 6:24 PM

## 2019-10-08 IMAGING — DX DG CHEST 2V
2 series · 2 of 2 positions shown · non-contrast
Comparison: 11/27/2016 and prior radiographs

CLINICAL DATA: Acute chest pain today.

EXAM:
CHEST - 2 VIEW

[chest pa]
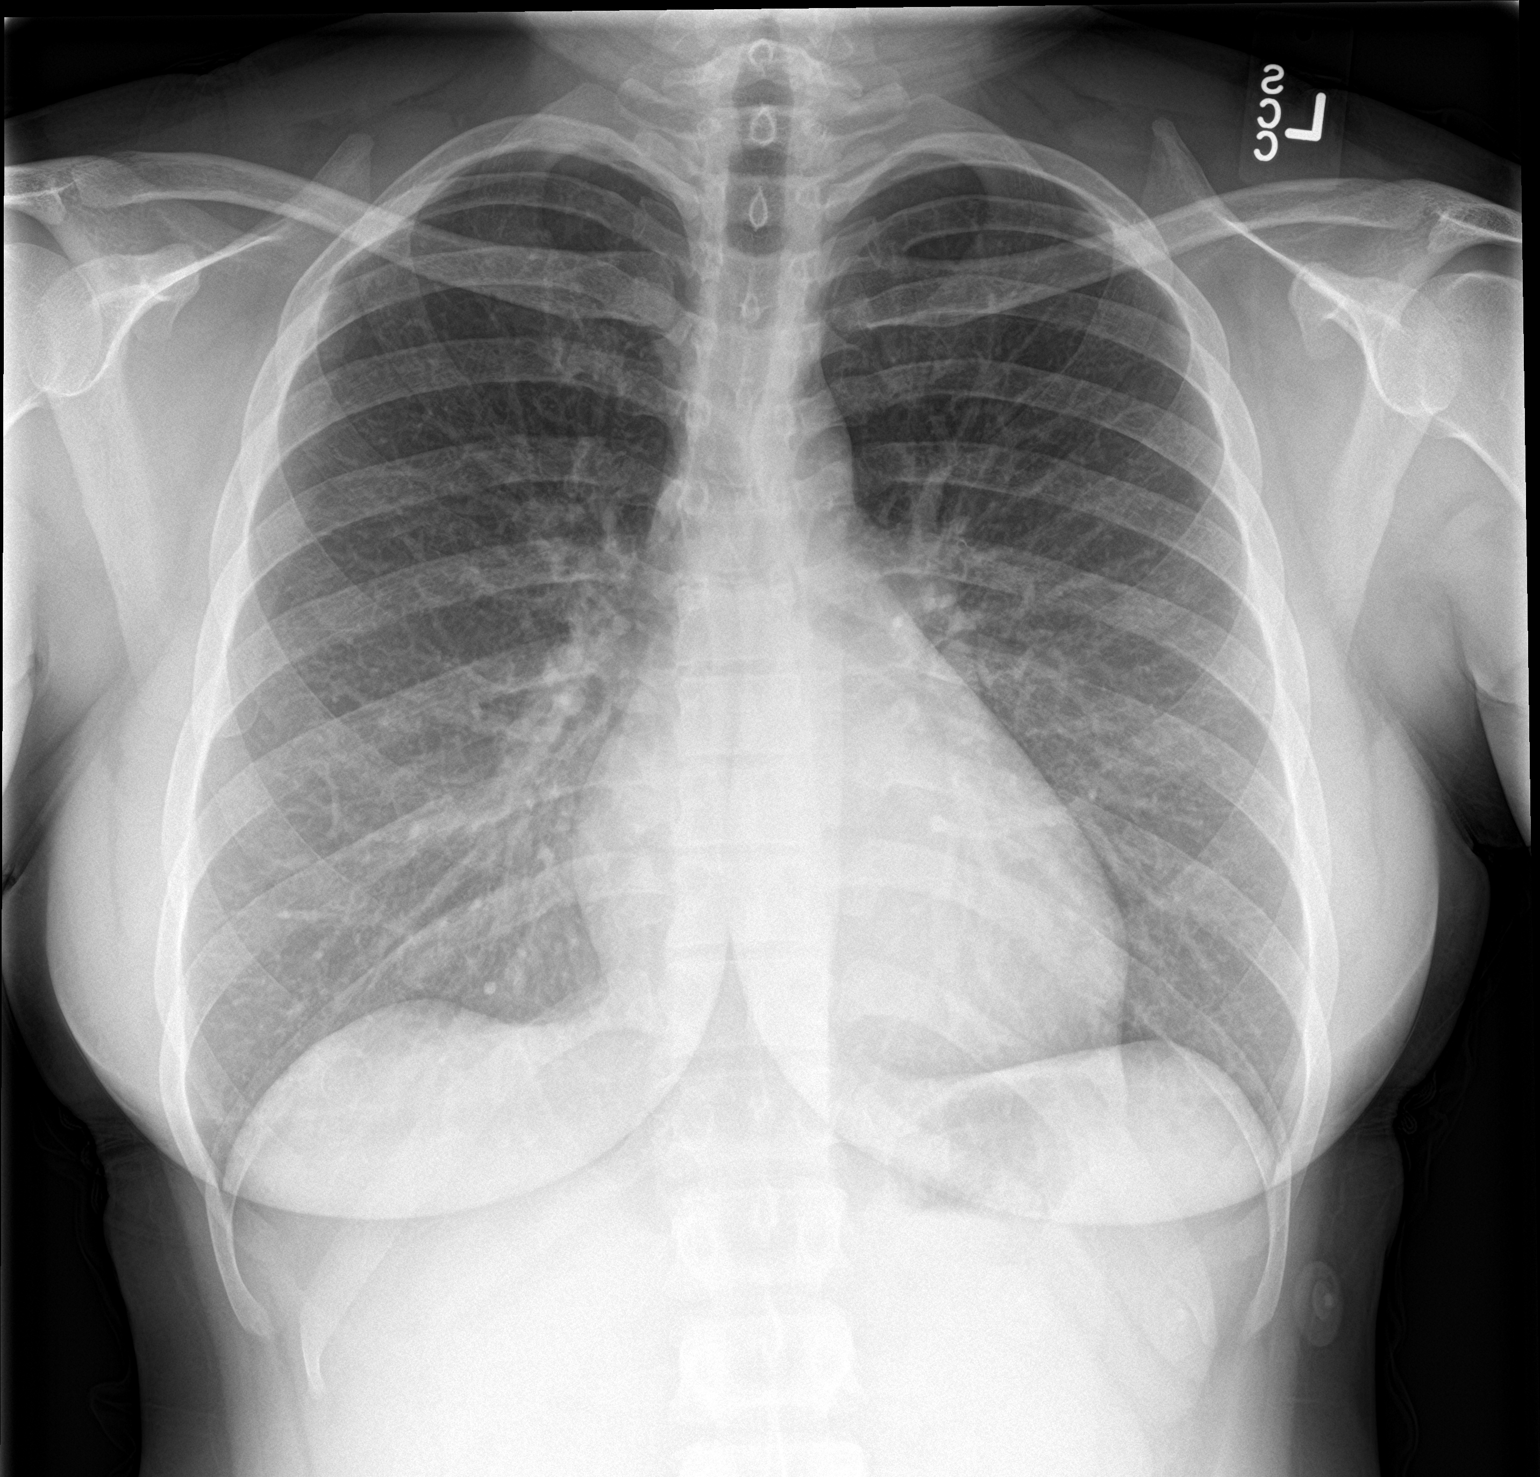

[chest lat]
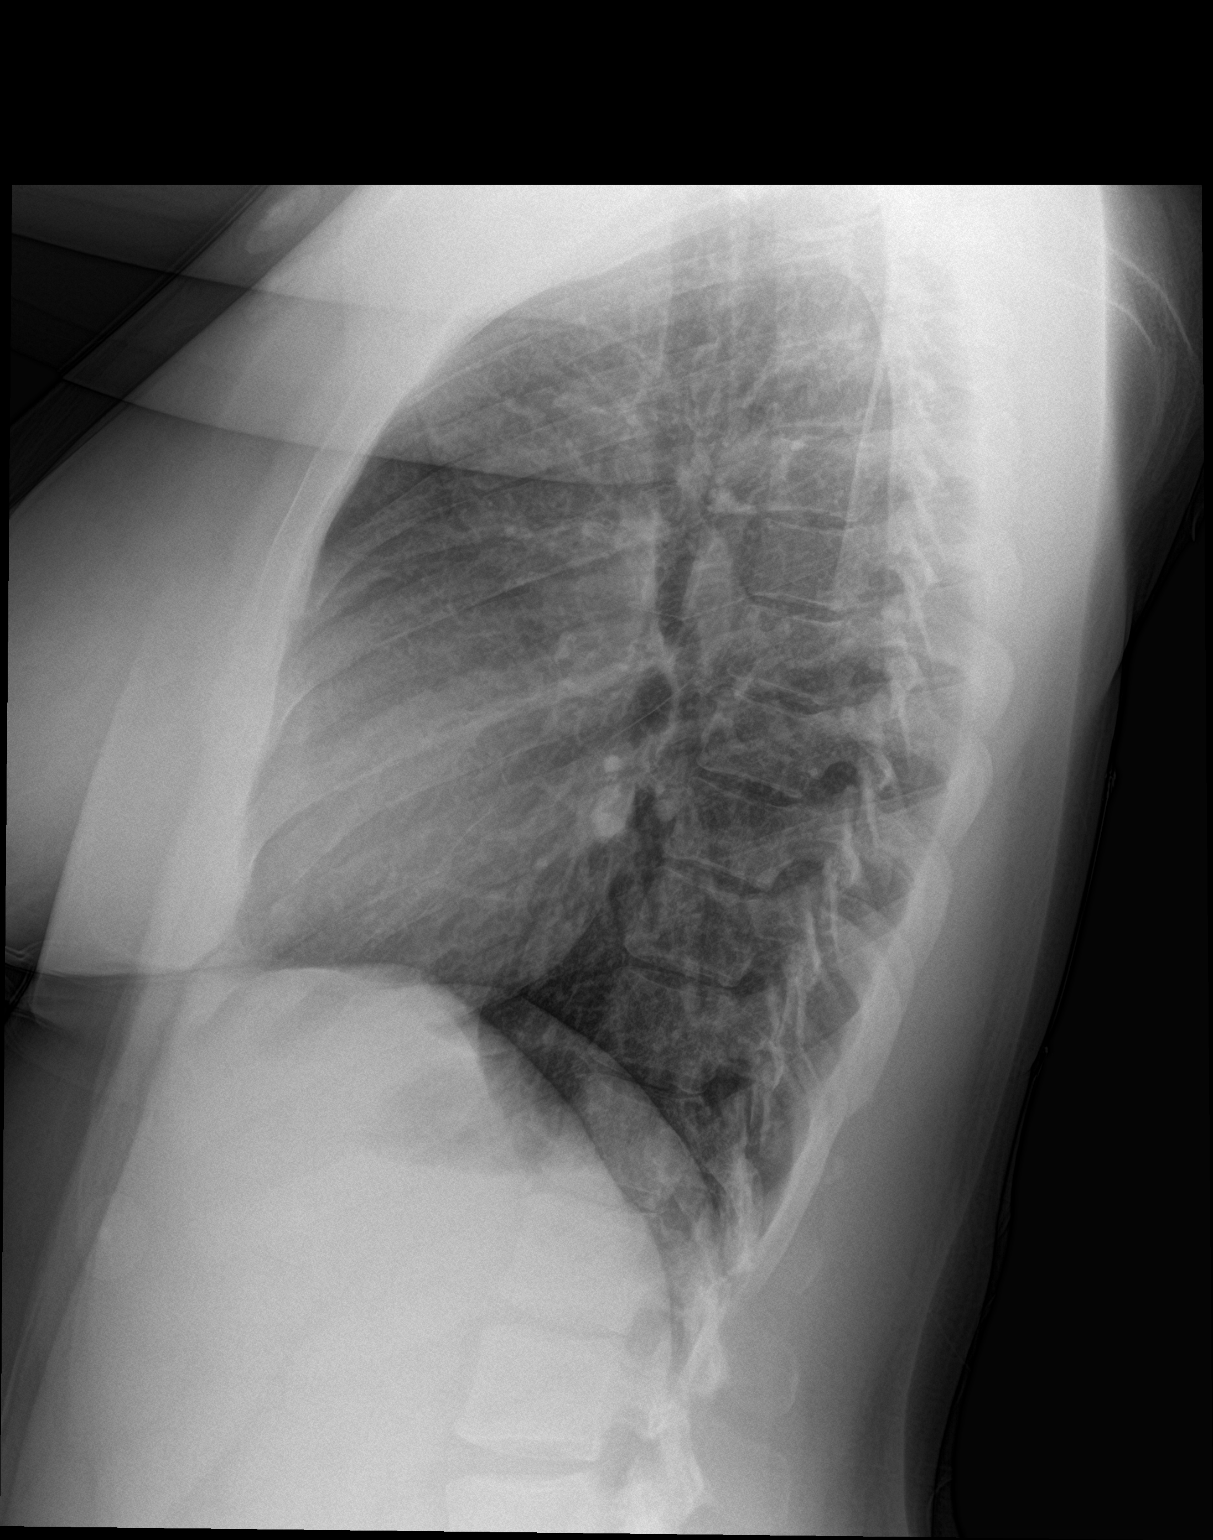

[2 of 2 positions shown; findings below may reference images not displayed]

FINDINGS: The cardiomediastinal silhouette is unremarkable.

There is no evidence of focal airspace disease, pulmonary edema,
suspicious pulmonary nodule/mass, pleural effusion, or pneumothorax.

No acute bony abnormalities are identified.
IMPRESSION: No active cardiopulmonary disease.

## 2021-01-27 ENCOUNTER — Encounter: Payer: Self-pay | Admitting: *Deleted
